# Patient Record
Sex: Female | Born: 1970
Health system: Southern US, Community
[De-identification: ages and names within clinical notes are randomized; demographics above are authoritative.]

## PROBLEM LIST (undated history)

## (undated) DIAGNOSIS — I10 Essential (primary) hypertension: Secondary | ICD-10-CM

---

## 1998-07-13 ENCOUNTER — Ambulatory Visit (HOSPITAL_BASED_OUTPATIENT_CLINIC_OR_DEPARTMENT_OTHER): Admission: RE | Admit: 1998-07-13 | Discharge: 1998-07-13 | Payer: Self-pay | Admitting: Specialist

## 2002-10-15 ENCOUNTER — Ambulatory Visit (HOSPITAL_COMMUNITY): Admission: RE | Admit: 2002-10-15 | Discharge: 2002-10-15 | Payer: Self-pay | Admitting: Family Medicine

## 2002-11-15 ENCOUNTER — Ambulatory Visit (HOSPITAL_COMMUNITY): Admission: RE | Admit: 2002-11-15 | Discharge: 2002-11-15 | Payer: Self-pay | Admitting: Cardiovascular Disease

## 2003-10-02 ENCOUNTER — Ambulatory Visit (HOSPITAL_COMMUNITY): Admission: RE | Admit: 2003-10-02 | Discharge: 2003-10-02 | Payer: Self-pay | Admitting: Orthopaedic Surgery

## 2006-10-11 ENCOUNTER — Ambulatory Visit (HOSPITAL_COMMUNITY): Admission: RE | Admit: 2006-10-11 | Discharge: 2006-10-11 | Payer: Self-pay | Admitting: Obstetrics & Gynecology

## 2006-10-13 ENCOUNTER — Inpatient Hospital Stay (HOSPITAL_COMMUNITY): Admission: AD | Admit: 2006-10-13 | Discharge: 2006-10-15 | Payer: Self-pay | Admitting: Obstetrics and Gynecology

## 2006-10-14 ENCOUNTER — Encounter (INDEPENDENT_AMBULATORY_CARE_PROVIDER_SITE_OTHER): Payer: Self-pay | Admitting: Obstetrics and Gynecology

## 2007-05-02 ENCOUNTER — Encounter (INDEPENDENT_AMBULATORY_CARE_PROVIDER_SITE_OTHER): Payer: Self-pay | Admitting: Obstetrics and Gynecology

## 2007-05-02 ENCOUNTER — Ambulatory Visit (HOSPITAL_COMMUNITY): Admission: RE | Admit: 2007-05-02 | Discharge: 2007-05-02 | Payer: Self-pay | Admitting: Obstetrics and Gynecology

## 2008-05-14 ENCOUNTER — Emergency Department (HOSPITAL_COMMUNITY): Admission: EM | Admit: 2008-05-14 | Discharge: 2008-05-14 | Payer: Self-pay | Admitting: Family Medicine

## 2008-05-28 ENCOUNTER — Emergency Department (HOSPITAL_COMMUNITY): Admission: EM | Admit: 2008-05-28 | Discharge: 2008-05-28 | Payer: Self-pay | Admitting: Family Medicine

## 2008-06-06 ENCOUNTER — Emergency Department (HOSPITAL_COMMUNITY): Admission: EM | Admit: 2008-06-06 | Discharge: 2008-06-06 | Payer: Self-pay | Admitting: Family Medicine

## 2010-02-18 ENCOUNTER — Encounter
Admission: RE | Admit: 2010-02-18 | Discharge: 2010-02-18 | Payer: Self-pay | Source: Home / Self Care | Attending: Cardiology | Admitting: Cardiology

## 2010-03-28 ENCOUNTER — Encounter: Payer: Self-pay | Admitting: Cardiology

## 2010-05-26 ENCOUNTER — Inpatient Hospital Stay (INDEPENDENT_AMBULATORY_CARE_PROVIDER_SITE_OTHER)
Admission: RE | Admit: 2010-05-26 | Discharge: 2010-05-26 | Disposition: A | Discharge status: Home / Self Care | Payer: 59 | Source: Ambulatory Visit | Attending: Emergency Medicine | Admitting: Emergency Medicine

## 2010-05-26 DIAGNOSIS — J309 Allergic rhinitis, unspecified: Secondary | ICD-10-CM

## 2010-05-26 DIAGNOSIS — J069 Acute upper respiratory infection, unspecified: Secondary | ICD-10-CM

## 2010-06-17 LAB — WET PREP, GENITAL: Trich, Wet Prep: NONE SEEN

## 2010-06-17 LAB — POCT PREGNANCY, URINE: Preg Test, Ur: NEGATIVE

## 2010-06-17 LAB — GC/CHLAMYDIA PROBE AMP, GENITAL
Chlamydia, DNA Probe: NEGATIVE
GC Probe Amp, Genital: NEGATIVE

## 2010-06-17 LAB — POCT URINALYSIS DIP (DEVICE)
Bilirubin Urine: NEGATIVE
Glucose, UA: NEGATIVE mg/dL
Hgb urine dipstick: NEGATIVE
Protein, ur: 30 mg/dL — AB
Specific Gravity, Urine: 1.02 (ref 1.005–1.030)

## 2010-07-20 NOTE — Op Note (Signed)
Felicia Boyd, Felicia Boyd                 ACCOUNT NO.:  [id number]   MEDICAL RECORD NO.:  [id number]          PATIENT TYPE:  INP   LOCATION:  9304                          FACILITY:  [facility]   PHYSICIAN:  Kendra H. Tenny Craw, MD     DATE OF BIRTH:  Aug 14, 1970   DATE OF PROCEDURE:  10/15/2006  DATE OF DISCHARGE:                               OPERATIVE REPORT   PREOPERATIVE DIAGNOSIS:  Retained products of conception.   POSTOPERATIVE DIAGNOSIS:  Retained products of conception.   PROCEDURE:  Suction D&C.   SURGEON:  Freddrick March. Tenny Craw, M.D.   ASSISTANT:  None.   ANESTHESIA:  Epidural.   SPECIMENS:  Products of conception disposal for pathology.   ESTIMATED BLOOD LOSS:  500 mL.   COMPLICATIONS:  None.   PROCEDURE:  The patient is a 40 year old G1 P0 who was admitted on  October 13, 2006 at 18 weeks and 6 days of gestation for induction of  labor for an anomalous fetus. She underwent induction of labor with  laminaria and misoprostol and achieved delivery of the fetus at  approximately 2000 hours on October 14, 2006. Immediately following  delivery of the fetus she received Methergine 0.2 mg IM x1 however,  there was no further delivery of placenta and 800 mcg of misoprostol  were then placed transrectally at 2200. By 2330 the placenta delivery  had not been achieved and a manual extraction was attempted, however  complete removal of the placenta was not achieved and the decision was  made to proceed with suction D&C for completion of removal of products  of conception. Following the appropriate informed consent the patient  was brought to the operating room where epidural anesthesia was  confirmed to be adequate.  She was placed in the lithotomy position in  Cecilia stirrups, prepped and draped in the normal sterile fashion.  A  speculum was placed into the vagina.  Single-tooth tenaculum was used to  grasp the anterior lip of the cervix and 10 mL of 1% lidocaine were  injected in a paracervical  fashion with some placental tissue was  removed ring forceps at the internal os. When a sharp curettage was  performed retained products were noted.  Several sharp passes were  performed and several suction passes were performed, however there still  was felt to be retained placental tissue at the fundus and the portable  ultrasound was obtained intraoperatively to directly visualize sharp  passes. Further placental tissue was removed and a final suction pass  was performed.  Final  ultrasound demonstrated a thin stripe with no evidence of retained  products. Estimated blood loss for this procedure was approximately 500  mL.  The patient tolerated the procedure well and was brought to the  recovery room in a stable condition following the procedure.      Freddrick March. Tenny Craw, MD  Electronically Signed     KHR/MEDQ  D:  10/15/2006  T:  10/15/2006  Job:  [id number]

## 2010-07-20 NOTE — H&P (Signed)
NAME:  Felicia Boyd, Felicia Boyd                 ACCOUNT NO.:  [id number]   MEDICAL RECORD NO.:  [id number]          PATIENT TYPE:  AMB   LOCATION:  SDC                           [facility]ILITY:  [facility]   PHYSICIAN:  Kendra H. Tenny Craw, MD     DATE OF BIRTH:  Oct 27, 1970   DATE OF ADMISSION:  05/02/2007  DATE OF DISCHARGE:                              HISTORY & PHYSICAL   CHIEF COMPLAINT:  9-week missed abortion.   HISTORY OF PRESENT ILLNESS:  Felicia Boyd is a 40 year old gravida 3, para  0-0-2-0 by a first trimester ultrasound at 6 weeks.  She has an  estimated date of confinement of November 26, 2007 with an estimated  gestational age of [redacted] weeks and 6 days today.  The patient noticed an  episode of spotting after intercourse last week.  This resolved itself  without cramping.  Again today, she re-experienced spotting and did have  some cramping and came in for evaluation.  Upon evaluation, the patient  was noted to have a an intrauterine pregnancy measuring 7 weeks and 1  day with no fetal cardiac activity identified, and a missed abortion was  confirmed.  After discussion with the patient regarding options of  expectant management verus medical management versus surgical  management, the patient elected to proceed with surgical management and  presents on May 02, 2007 for a suction D&C.   PAST MEDICAL HISTORY:  Chronic hypertension.  She had previously been  taking Toprol-XL outside of pregnancy.  However, during this pregnancy  she has been normotensive and has not been on any medications.   PAST SURGICAL HISTORY:  A D&C in 2008 for retained placenta.   PAST OBSTETRICAL HISTORY:  Gravida 3, para 0-0-2-0.  In 1994, first  trimester spontaneous abortion.  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  She did require a D&C for retained placenta.   PAST GYN HISTORY:  No abnormal  Pap smears.  No STDs.   SOCIAL HISTORY:  Negative x3.   FAMILY HISTORY:  Noncontributory.   PHYSICAL EXAMINATION:  VITAL SIGNS:  She weighs 200 pounds.  Blood  pressure is 102/70.  GENERAL:  She is alert and oriented x3.  ABDOMEN:  Soft and nontender.  PELVIC:  Normal external female genitalia.  The vagina is pink and well-  rugated.  There is some red menstrual-appearing fluid at the vaginal  vault.  No active bleeding is noted.  No clots.   Ultrasound x2 confirms an intrauterine fetal pole measuring 7 weeks and  1 day with no visible cardiac activity.   ASSESSMENT AND PLAN:  This is a gravida 3, para 0-0-2-0 at 9 weeks and 6  days with a missed abortion.  We will proceed with suction dilatation  and curettage.      Freddrick March. Tenny Craw, MD  Electronically Signed     KHR/MEDQ  D:  05/01/2007  T:  05/01/2007  Job:  [id number]

## 2010-07-20 NOTE — Discharge Summary (Signed)
[name], Felicia Boyd                 ACCOUNT NO.:  [id number]   MEDICAL RECORD NO.:  [id number]          PATIENT TYPE:  INP   LOCATION:  9304                          [facility]   PHYSICIAN:  Kendra H. Tenny Craw, MD     DATE OF BIRTH:  1970/06/19   DATE OF ADMISSION:  10/13/2006  DATE OF DISCHARGE:  10/15/2006                               DISCHARGE SUMMARY   FINAL DIAGNOSIS:  1. Intrauterine pregnancy at [redacted] weeks gestation.  2. Multiple fetal anomalies.  3. Induction of labor.   PROCEDURE:  Suction D&C.   SURGEON:  Dr. Almon Hercules.   COMPLICATIONS:  None.   HISTORY:  This 40 year old G1, P0, was admitted to the [facility]  at 60 and 6/7 weeks' gestation for induction of labor secondary to fetus  with multiple known anomalies.  The patient's antepartum course up to  this point had been complicated when she had an ultrasound that showed  bilateral ventramegaly with encephalocele.  The patient did have  amniocentesis which did show 46XX karyotype.  The patient does have  chronic hypertension controlled with Labetalol 200mg  BID and advanced  maternal age.  The patient was evaluated by the High Risk OB service and  ultrasound findings were reconfirmed.  The patient was counseled  regarding poor prognosis given significant ventriculomegaly and  encephalocele.  She elected to proceed with pregnancy termination.  At  this point she was admitted by Dr. Waynard Reeds.  She had laminaria and  misoprostol and she achieved delivery on 10/14/2006.  The placenta did  not immediately deliver and manual extraction was unsuccessful.  The  patient, at this point, had to be taken to the operating room by Dr.  Waynard Reeds where a D&C was performed for removal of products of  conception.  The procedure was performed without complications.  The  patient's hospital course was benign without any significant fevers.  The patient did request autopsy of the fetus have some support.   DISCHARGE  MEDICATIONS:  She was sent home on Vicodin 5 mg, 1-2 every 4-6  hours as needed for her pain.  She was given Ambien CR 12.5 mg to use at  bedtime as needed, to continue with folic acid 4 mg daily with followup  with Dr. Waynard Reeds in 2 weeks.   LABORATORY DATA:  Hemoglobin of 11.3, white blood cell count of 13.9 and  platelets of 187,000.      Leilani Able, P.A.-C.      Freddrick March. Tenny Craw, MD  Electronically Signed    MB/MEDQ  D:  11/15/2006  T:  11/16/2006  Job:  [id number]

## 2010-07-20 NOTE — Op Note (Signed)
NAME:  Felicia Boyd, Felicia Boyd                 ACCOUNT NO.:  [id number]   MEDICAL RECORD NO.:  [id number]          PATIENT TYPE:  AMB   LOCATION:  SDC                           [facility]   PHYSICIAN:  Kendra H. Tenny Craw, MD     DATE OF BIRTH:  [birth year]   DATE OF PROCEDURE:  05/02/2007  DATE OF DISCHARGE:                               OPERATIVE REPORT   PREOPERATIVE DIAGNOSIS:  9-week missed abortion.   POSTOPERATIVE DIAGNOSIS:  9-week missed abortion.   PROCEDURE:  Suction dilation and curettage.   SURGEON:  Freddrick March. Tenny Craw, MD   ASSISTANT:  None.   ANESTHESIA:  MAC and 1% lidocaine paracervical block 10 mL.   OPERATIVE FINDINGS:  Uterus approximately eight week size with products  of conception.   SPECIMENS:  Products of conception.   DISPOSITION OF SPECIMENS:  To pathology and for karyotype analysis.   ESTIMATED BLOOD LOSS:  Minimal.   COMPLICATIONS:  None.   PROCEDURE:  [name] is a 40 year old G3, P0-0-2-0 who was  approximately 9 weeks and 6 days by a ultrasound at 6 weeks, who  presented to the office on May 01, 2007 complaining of spotting.  An ultrasound was performed at that time which demonstrated a  intrauterine pregnancy with the fetal pole measuring 7 weeks and one  days with no cardiac activity was identified.  She had previously had  two separate ultrasounds one at 6 weeks and 2 days, one at 7 weeks and 3  days which demonstrated a viable intrauterine pregnancy and therefore  this confirmed fetal demise.  The patient was offered expectant medical  versus surgical management and elected to proceed with surgical  management.  Risks, benefits and alternatives of the procedure were  discussed including damage to internal organs, bowels, bladder, blood  vessels, bleeding, infection and the patient wished to proceed with the  procedure.   Following the appropriate informed consent, the patient was brought to  the operating room where MAC anesthesia was  administered.  She was  placed in the dorsal lithotomy position.  She was prepped and draped in  the normal sterile fashion.  The bladder was drained of approximately  100 mL of clear urine.  A speculum was placed in the vagina.  Single-  toothed tenaculum was used to grasp the anterior lip of the cervix.  A  1% paracervical block of lidocaine was then administered.  The cervix  was serially dilated.  A #7 suction curette was then passed to the  fundus.  Vacuum was engaged.  Three suction curettage passes were  performed with removal of products of conception.  A sharp curettage was  then performed with further removal of products of conception.  A fourth  suction pass was performed with minimal residual products of conception  and a final curettage pass was performed with minimal further products  of conception.  This completed the procedure.  The single-toothed  tenaculum was removed from the anterior lip of the cervix.  The cervix  was found to be hemostatic.  The speculum was then removed from the  vagina.  The MAC anesthesia was reversed and the patient was brought to  the recovery room in stable condition following the procedure.      Freddrick March. Tenny Craw, MD  Electronically Signed     KHR/MEDQ  D:  05/02/2007  T:  05/03/2007  Job:  [id number]

## 2010-07-23 NOTE — Procedures (Signed)
   NAME:  Felicia Boyd, Felicia Boyd                           ACCOUNT NO.:  [id number]   MEDICAL RECORD NO.:  [id number]                   PATIENT TYPE:  OUT   LOCATION:  RAD                                  FACILITY:  [facility]   PHYSICIAN:  Nicki Guadalajara, M.D.                  DATE OF BIRTH:  12-21-1970   DATE OF PROCEDURE:  11/15/2002  DATE OF DISCHARGE:                                  ECHOCARDIOGRAM   PROCEDURE:  2-D echo Doppler study.   INDICATIONS FOR PROCEDURE:  Felicia Boyd is a 40 year old African-  American female that was recently found to have intermittent low atrial  rhythm. She has noticed some palpitations and has a soft murmur. She is  referred for further evaluation.   SUMMARY:  1. Technically, this is an adequate M-mode 2-dimensional comprehensive echo     Doppler study.  2. Left ventricular wall thickness was upper normal at 11 mm septally and 9     mm posteriorly. The left ventricular end diastolic and end systolic     dimensions are normal at 44 and 28 mm, respectively. Systolic function     was excellent with an estimated ejection fraction of at least 55% to 60%.     There were no focal segmental wall motion abnormalities.  3. Left atrial dimension was upper normal to slightly increased at 41 mm.     Right atrium and right ventricle were normal.  4. Aortic root dimension was normal at 26 mm.  5. Aortic valve was trileaflet and delicate. There was no aortic     regurgitation.  6. Mitral valve leaflets were relatively delicate. There was minimal     thickening of the anterior leaflet. There was trivial/physiologic mitral     regurgitation, not felt to be significant.  7. The tricuspid valve was structurally normal.  8. The pulmonic valve was structurally normal.  9. Doppler study did not demonstrate any abnormal increased velocities.    IMPRESSION:  Technically, this is an adequate M-mode 2-dimensional  comprehensive Doppler echocardiogram, demonstrating normal left  ventricular  size and function. There is very minimal left atrial enlargement at 41 mm.  There is trivial/physiologic mitral regurgitation, not felt to be  significant.                                                Nicki Guadalajara, M.D.    TK/MEDQ  D:  11/15/2002  T:  11/15/2002  Job:  [id number]   cc:   Annia Friendly. Loleta Chance, M.D.  [address]  [place]  [place] [address]  Fax: [phone]

## 2010-08-17 ENCOUNTER — Other Ambulatory Visit: Payer: Self-pay | Admitting: Obstetrics and Gynecology

## 2010-09-21 ENCOUNTER — Encounter (INDEPENDENT_AMBULATORY_CARE_PROVIDER_SITE_OTHER): Payer: 59

## 2010-09-21 DIAGNOSIS — R030 Elevated blood-pressure reading, without diagnosis of hypertension: Secondary | ICD-10-CM

## 2010-11-26 LAB — CBC
MCHC: 34.7
Platelets: 243
RBC: 4.81
WBC: 6.1

## 2010-12-20 LAB — CBC
HCT: 33.2 — ABNORMAL LOW
Hemoglobin: 13.3
MCHC: 33.6
MCV: 88
MCV: 88.2
Platelets: 243
RBC: 4.5
WBC: 9.9

## 2011-01-14 ENCOUNTER — Other Ambulatory Visit: Payer: Self-pay | Admitting: Family Medicine

## 2011-01-14 DIAGNOSIS — Z1231 Encounter for screening mammogram for malignant neoplasm of breast: Secondary | ICD-10-CM

## 2011-02-23 ENCOUNTER — Ambulatory Visit
Admission: RE | Admit: 2011-02-23 | Discharge: 2011-02-23 | Disposition: A | Discharge status: Home / Self Care | Payer: 59 | Source: Ambulatory Visit | Attending: Family Medicine | Admitting: Family Medicine

## 2011-02-23 DIAGNOSIS — Z1231 Encounter for screening mammogram for malignant neoplasm of breast: Secondary | ICD-10-CM

## 2012-02-08 ENCOUNTER — Other Ambulatory Visit: Payer: Self-pay | Admitting: Family Medicine

## 2012-02-08 DIAGNOSIS — Z1231 Encounter for screening mammogram for malignant neoplasm of breast: Secondary | ICD-10-CM

## 2012-03-15 ENCOUNTER — Ambulatory Visit
Admission: RE | Admit: 2012-03-15 | Discharge: 2012-03-15 | Disposition: A | Discharge status: Home / Self Care | Payer: 59 | Source: Ambulatory Visit | Attending: Family Medicine | Admitting: Family Medicine

## 2012-03-15 DIAGNOSIS — Z1231 Encounter for screening mammogram for malignant neoplasm of breast: Secondary | ICD-10-CM

## 2012-12-20 ENCOUNTER — Other Ambulatory Visit: Payer: Self-pay | Admitting: Obstetrics and Gynecology

## 2013-03-18 ENCOUNTER — Other Ambulatory Visit: Payer: Self-pay

## 2013-03-18 DIAGNOSIS — Z1231 Encounter for screening mammogram for malignant neoplasm of breast: Secondary | ICD-10-CM

## 2013-04-05 ENCOUNTER — Ambulatory Visit
Admission: RE | Admit: 2013-04-05 | Discharge: 2013-04-05 | Disposition: A | Discharge status: Home / Self Care | Payer: Self-pay | Source: Ambulatory Visit

## 2013-04-05 DIAGNOSIS — Z1231 Encounter for screening mammogram for malignant neoplasm of breast: Secondary | ICD-10-CM

## 2013-06-14 ENCOUNTER — Emergency Department (HOSPITAL_COMMUNITY)
Admission: EM | Admit: 2013-06-14 | Discharge: 2013-06-14 | Disposition: A | Discharge status: Home / Self Care | Payer: 59 | Source: Home / Self Care | Attending: Emergency Medicine | Admitting: Emergency Medicine

## 2013-06-14 ENCOUNTER — Encounter (HOSPITAL_COMMUNITY): Payer: Self-pay | Admitting: Emergency Medicine

## 2013-06-14 DIAGNOSIS — B029 Zoster without complications: Secondary | ICD-10-CM

## 2013-06-14 HISTORY — DX: Essential (primary) hypertension: I10

## 2013-06-14 MED ORDER — PREDNISONE 20 MG PO TABS: 20.0000 mg | ORAL_TABLET | Freq: Two times a day (BID) | ORAL | Status: DC

## 2013-06-14 MED ORDER — HYDROCODONE-ACETAMINOPHEN 5-325 MG PO TABS: ORAL_TABLET | ORAL | Status: DC

## 2013-06-14 MED ORDER — VALACYCLOVIR HCL 1 G PO TABS: 1000.0000 mg | ORAL_TABLET | Freq: Two times a day (BID) | ORAL | Status: DC

## 2013-06-14 NOTE — ED Provider Notes (Signed)
  Chief Complaint    Chief Complaint  Patient presents with  . Rash    History of Present Illness      Felicia Boyd is a 43 year old female who has had a three-day history of a rash on her right upper chest and upper back. It's painful and it tingles. She denies any rash elsewhere, fever, chills, weakness, or numbness in the upper extremity. She has had chickenpox in the past. No history of shingles.  Review of Systems   Other than as noted above, the patient denies any of the following symptoms: Systemic:  No fever or chills. ENT:  No nasal congestion, rhinorrhea, sore throat, swelling of lips, tongue or throat. Resp:  No cough, wheezing, or shortness of breath.  PMFSH    Past medical history, family history, social history, meds, and allergies were reviewed. She has high blood pressure and takes hydrochlorothiazide, losartan, and Lopressor.  Physical Exam     Vital signs:  BP 142/97  Pulse 84  Temp(Src) 97.8 F (36.6 C) (Oral)  Resp 18  SpO2 97%  LMP 05/29/2013 Gen:  Alert, oriented, in no distress. ENT:  Pharynx clear, no intraoral lesions, moist mucous membranes. Lungs:  Clear to auscultation. Skin:  There are patches of erythematous maculopapules on the right upper back and right upper chest. No vesicles. No extra dermatomal lesions.  Assessment    The encounter diagnosis was Shingles.  Plan     1.  Meds:  The following meds were prescribed:   Discharge Medication List as of 06/14/2013  8:40 PM    START taking these medications   Details  HYDROcodone-acetaminophen (NORCO/VICODIN) 5-325 MG per tablet 1 to 2 tabs every 4 to 6 hours as needed for pain., Print    predniSONE (DELTASONE) 20 MG tablet Take 1 tablet (20 mg total) by mouth 2 (two) times daily., Starting 06/14/2013, Until Discontinued, Normal    valACYclovir (VALTREX) 1000 MG tablet Take 1 tablet (1,000 mg total) by mouth 2 (two) times daily., Starting 06/14/2013, Until Discontinued, Normal        2.   Patient Education/Counseling:  The patient was given appropriate handouts, self care instructions, and instructed in symptomatic relief.    3.  Follow up:  The patient was told to follow up here if no better in 3 to 4 days, or sooner if becoming worse in any way, and given some red flag symptoms such as worsening rash, fever, or difficulty breathing which would prompt immediate return.  Follow up here if necessary.      Reuben Likesavid C Eliezer Khawaja, MD 06/14/13 2132

## 2013-06-14 NOTE — Discharge Instructions (Signed)
Shingles Shingles (herpes zoster) is an infection that is caused by the same virus that causes chickenpox (varicella). The infection causes a painful skin rash and fluid-filled blisters, which eventually break open, crust over, and heal. It may occur in any area of the body, but it usually affects only one side of the body or face. The pain of shingles usually lasts about 1 month. However, some people with shingles may develop long-term (chronic) pain in the affected area of the body. Shingles often occurs many years after the person had chickenpox. It is more common:  In people older than 50 years.  In people with weakened immune systems, such as those with HIV, AIDS, or cancer.  In people taking medicines that weaken the immune system, such as transplant medicines.  In people under great stress. CAUSES  Shingles is caused by the varicella zoster virus (VZV), which also causes chickenpox. After a person is infected with the virus, it can remain in the person's body for years in an inactive state (dormant). To cause shingles, the virus reactivates and breaks out as an infection in a nerve root. The virus can be spread from person to person (contagious) through contact with open blisters of the shingles rash. It will only spread to people who have not had chickenpox. When these people are exposed to the virus, they may develop chickenpox. They will not develop shingles. Once the blisters scab over, the person is no longer contagious and cannot spread the virus to others. SYMPTOMS  Shingles shows up in stages. The initial symptoms may be pain, itching, and tingling in an area of the skin. This pain is usually described as burning, stabbing, or throbbing.In a few days or weeks, a painful red rash will appear in the area where the pain, itching, and tingling were felt. The rash is usually on one side of the body in a band or belt-like pattern. Then, the rash usually turns into fluid-filled blisters. They  will scab over and dry up in approximately 2 3 weeks. Flu-like symptoms may also occur with the initial symptoms, the rash, or the blisters. These may include:  Fever.  Chills.  Headache.  Upset stomach. DIAGNOSIS  Your caregiver will perform a skin exam to diagnose shingles. Skin scrapings or fluid samples may also be taken from the blisters. This sample will be examined under a microscope or sent to a lab for further testing. TREATMENT  There is no specific cure for shingles. Your caregiver will likely prescribe medicines to help you manage the pain, recover faster, and avoid long-term problems. This may include antiviral drugs, anti-inflammatory drugs, and pain medicines. HOME CARE INSTRUCTIONS   Take a cool bath or apply cool compresses to the area of the rash or blisters as directed. This may help with the pain and itching.   Only take over-the-counter or prescription medicines as directed by your caregiver.   Rest as directed by your caregiver.  Keep your rash and blisters clean with mild soap and cool water or as directed by your caregiver.  Do not pick your blisters or scratch your rash. Apply an anti-itch cream or numbing creams to the affected area as directed by your caregiver.  Keep your shingles rash covered with a loose bandage (dressing).  Avoid skin contact with:  Babies.   Pregnant women.   Children with eczema.   Elderly people with transplants.   People with chronic illnesses, such as leukemia or AIDS.   Wear loose-fitting clothing to help ease   the pain of material rubbing against the rash.  Keep all follow-up appointments with your caregiver.If the area involved is on your face, you may receive a referral for follow-up to a specialist, such as an eye doctor (ophthalmologist) or an ear, nose, and throat (ENT) doctor. Keeping all follow-up appointments will help you avoid eye complications, chronic pain, or disability.  SEEK IMMEDIATE MEDICAL  CARE IF:   You have facial pain, pain around the eye area, or loss of feeling on one side of your face.  You have ear pain or ringing in your ear.  You have loss of taste.  Your pain is not relieved with prescribed medicines.   Your redness or swelling spreads.   You have more pain and swelling.  Your condition is worsening or has changed.   You have a feveror persistent symptoms for more than 2 3 days.  You have a fever and your symptoms suddenly get worse. MAKE SURE YOU:  Understand these instructions.  Will watch your condition.  Will get help right away if you are not doing well or get worse. Document Released: 02/21/2005 Document Revised: 11/16/2011 Document Reviewed: 10/06/2011 [employer] Patient Information 2014 [employer].  

## 2013-06-14 NOTE — ED Notes (Signed)
Pt c/o rash on right side of chest and wraps around back upper right Rash is pain and tingly; thinks it maybe the shingles Denies f/v/n/d, cold sxs Alert w/no signs of acute distress.

## 2014-02-06 ENCOUNTER — Other Ambulatory Visit: Payer: Self-pay | Admitting: Obstetrics and Gynecology

## 2014-02-07 LAB — CYTOLOGY - PAP

## 2014-04-22 ENCOUNTER — Other Ambulatory Visit: Payer: Self-pay

## 2014-04-22 DIAGNOSIS — Z1231 Encounter for screening mammogram for malignant neoplasm of breast: Secondary | ICD-10-CM

## 2014-05-12 ENCOUNTER — Other Ambulatory Visit: Payer: Self-pay

## 2014-05-12 ENCOUNTER — Ambulatory Visit
Admission: RE | Admit: 2014-05-12 | Discharge: 2014-05-12 | Disposition: A | Discharge status: Home / Self Care | Payer: 59 | Source: Ambulatory Visit

## 2014-05-12 DIAGNOSIS — Z1231 Encounter for screening mammogram for malignant neoplasm of breast: Secondary | ICD-10-CM

## 2015-04-30 MED FILL — METOPROLOL SUCC ER 25 MG TA: 25 | 90 days supply | Qty: 90 | Fill #0

## 2015-04-30 MED FILL — LOSARTAN POTASSIUM 100 MG T: 100 | 90 days supply | Qty: 90 | Fill #0

## 2015-04-30 MED FILL — HYDROCHLOROTHIAZIDE 12.5 MG: 12.5 | 90 days supply | Qty: 90 | Fill #0

## 2015-07-28 ENCOUNTER — Other Ambulatory Visit: Payer: Self-pay

## 2015-07-28 DIAGNOSIS — Z1231 Encounter for screening mammogram for malignant neoplasm of breast: Secondary | ICD-10-CM

## 2015-08-05 ENCOUNTER — Ambulatory Visit
Admission: RE | Admit: 2015-08-05 | Discharge: 2015-08-05 | Disposition: A | Discharge status: Home / Self Care | Payer: 59 | Source: Ambulatory Visit

## 2015-08-05 DIAGNOSIS — Z1231 Encounter for screening mammogram for malignant neoplasm of breast: Secondary | ICD-10-CM

## 2015-08-07 ENCOUNTER — Other Ambulatory Visit: Payer: Self-pay | Admitting: Family Medicine

## 2015-08-07 DIAGNOSIS — R928 Other abnormal and inconclusive findings on diagnostic imaging of breast: Secondary | ICD-10-CM

## 2015-08-11 ENCOUNTER — Ambulatory Visit
Admission: RE | Admit: 2015-08-11 | Discharge: 2015-08-11 | Disposition: A | Discharge status: Home / Self Care | Payer: 59 | Source: Ambulatory Visit | Attending: Family Medicine | Admitting: Family Medicine

## 2015-08-11 ENCOUNTER — Other Ambulatory Visit: Payer: Self-pay | Admitting: Obstetrics and Gynecology

## 2015-08-11 DIAGNOSIS — Z6838 Body mass index (BMI) 38.0-38.9, adult: Secondary | ICD-10-CM | POA: Diagnosis not present

## 2015-08-11 DIAGNOSIS — Z01419 Encounter for gynecological examination (general) (routine) without abnormal findings: Secondary | ICD-10-CM | POA: Diagnosis not present

## 2015-08-11 DIAGNOSIS — R928 Other abnormal and inconclusive findings on diagnostic imaging of breast: Secondary | ICD-10-CM

## 2015-08-11 DIAGNOSIS — Z124 Encounter for screening for malignant neoplasm of cervix: Secondary | ICD-10-CM | POA: Diagnosis not present

## 2015-08-11 MED FILL — IBUPROFEN 600 MG TABLET: 600 | 90 days supply | Qty: 90 | Fill #0

## 2015-08-12 LAB — CYTOLOGY - PAP

## 2015-08-20 MED FILL — LOSARTAN POTASSIUM 100 MG T: 100 | 90 days supply | Qty: 90 | Fill #1

## 2015-08-20 MED FILL — METOPROLOL SUCC ER 25 MG TA: 25 | 90 days supply | Qty: 90 | Fill #1

## 2015-08-20 MED FILL — HYDROCHLOROTHIAZIDE 12.5 MG: 12.5 | 90 days supply | Qty: 90 | Fill #1

## 2015-12-24 MED FILL — HYDROCHLOROTHIAZIDE 12.5 MG: 12.5 | 90 days supply | Qty: 90 | Fill #2

## 2015-12-24 MED FILL — METOPROLOL SUCC ER 25 MG TA: 25 | 90 days supply | Qty: 90 | Fill #2

## 2015-12-24 MED FILL — LOSARTAN POTASSIUM 100 MG T: 100 | 90 days supply | Qty: 90 | Fill #2

## 2016-02-03 DIAGNOSIS — E785 Hyperlipidemia, unspecified: Secondary | ICD-10-CM | POA: Diagnosis not present

## 2016-02-03 DIAGNOSIS — I10 Essential (primary) hypertension: Secondary | ICD-10-CM | POA: Diagnosis not present

## 2016-04-11 MED FILL — LOSARTAN POTASSIUM 100 MG T: 100 | 90 days supply | Qty: 90 | Fill #0

## 2016-04-11 MED FILL — METOPROLOL SUCC ER 25 MG TA: 25 | 90 days supply | Qty: 90 | Fill #0

## 2016-04-11 MED FILL — HYDROCHLOROTHIAZIDE 12.5 MG: 12.5 | 90 days supply | Qty: 90 | Fill #0

## 2016-04-11 MED FILL — IBUPROFEN 600 MG TABLET: 600 | 90 days supply | Qty: 90 | Fill #1

## 2016-07-05 ENCOUNTER — Other Ambulatory Visit: Payer: Self-pay | Admitting: Family Medicine

## 2016-07-05 DIAGNOSIS — Z1231 Encounter for screening mammogram for malignant neoplasm of breast: Secondary | ICD-10-CM

## 2016-07-29 DIAGNOSIS — Z0000 Encounter for general adult medical examination without abnormal findings: Secondary | ICD-10-CM | POA: Diagnosis not present

## 2016-07-29 DIAGNOSIS — E785 Hyperlipidemia, unspecified: Secondary | ICD-10-CM | POA: Diagnosis not present

## 2016-07-29 DIAGNOSIS — I10 Essential (primary) hypertension: Secondary | ICD-10-CM | POA: Diagnosis not present

## 2016-07-29 MED FILL — LANSOPRAZOLE DR 30 MG CAP: 30 | 90 days supply | Qty: 90 | Fill #0

## 2016-07-29 MED FILL — LOSARTAN POTASSIUM 100 MG T: 100 | 90 days supply | Qty: 90 | Fill #0

## 2016-07-29 MED FILL — HYDROCHLOROTHIAZIDE 12.5 MG: 12.5 | 90 days supply | Qty: 90 | Fill #0

## 2016-07-29 MED FILL — IBUPROFEN 600 MG TABLET: 600 | 90 days supply | Qty: 90 | Fill #2

## 2016-07-29 MED FILL — METOPROLOL SUCC ER 25 MG TA: 25 | 90 days supply | Qty: 90 | Fill #0

## 2016-08-11 ENCOUNTER — Ambulatory Visit
Admission: RE | Admit: 2016-08-11 | Discharge: 2016-08-11 | Disposition: A | Discharge status: Home / Self Care | Payer: 59 | Source: Ambulatory Visit | Attending: Family Medicine | Admitting: Family Medicine

## 2016-08-11 DIAGNOSIS — Z1231 Encounter for screening mammogram for malignant neoplasm of breast: Secondary | ICD-10-CM

## 2016-10-17 DIAGNOSIS — Z124 Encounter for screening for malignant neoplasm of cervix: Secondary | ICD-10-CM | POA: Diagnosis not present

## 2016-10-17 DIAGNOSIS — Z01419 Encounter for gynecological examination (general) (routine) without abnormal findings: Secondary | ICD-10-CM | POA: Diagnosis not present

## 2017-01-18 MED FILL — METOPROLOL SUCC ER 25 MG TA: 25 | 90 days supply | Qty: 90 | Fill #1

## 2017-01-18 MED FILL — LOSARTAN POTASSIUM 100 MG T: 100 | 90 days supply | Qty: 90 | Fill #1

## 2017-01-18 MED FILL — IBUPROFEN 600 MG TABLET: 600 | 90 days supply | Qty: 90 | Fill #0

## 2017-01-18 MED FILL — HYDROCHLOROTHIAZIDE 12.5 MG: 12.5 | 90 days supply | Qty: 90 | Fill #1

## 2017-05-25 MED FILL — HYDROCHLOROTHIAZIDE 12.5 MG: 12.5 | 90 days supply | Qty: 90 | Fill #2

## 2017-05-25 MED FILL — LANSOPRAZOLE DR 30 MG CAP: 30 | 90 days supply | Qty: 90 | Fill #1

## 2017-05-25 MED FILL — METOPROLOL SUCCINATE ER 25: 25 | 90 days supply | Qty: 90 | Fill #2

## 2017-05-25 MED FILL — LOSARTAN POTASSIUM 100 MG T: 100 | 90 days supply | Qty: 90 | Fill #2

## 2017-06-27 ENCOUNTER — Other Ambulatory Visit: Payer: Self-pay | Admitting: Family Medicine

## 2017-06-27 DIAGNOSIS — Z1231 Encounter for screening mammogram for malignant neoplasm of breast: Secondary | ICD-10-CM

## 2017-06-28 ENCOUNTER — Telehealth: Payer: 59 | Admitting: Nurse Practitioner

## 2017-06-28 DIAGNOSIS — R05 Cough: Secondary | ICD-10-CM

## 2017-06-28 DIAGNOSIS — R059 Cough, unspecified: Secondary | ICD-10-CM

## 2017-06-28 MED ORDER — BENZONATATE 100 MG PO CAPS
100.0000 mg | ORAL_CAPSULE | Freq: Three times a day (TID) | ORAL | 0 refills | Status: DC | PRN
Start: 2017-06-28 — End: 2023-03-15

## 2017-06-28 NOTE — Progress Notes (Signed)
We are sorry that you are not feeling well.  Here is how we plan to help!  Based on your presentation I believe you most likely have A cough due to allergies.  I recommend that you start the an over-the counter-allergy medication such as Claritin 10 mg or Zyrtec 10 mg daily.     In addition you may use A prescription cough medication called Tessalon Perles 100mg. You may take 1-2 capsules every 8 hours as needed for your cough.   From your responses in the eVisit questionnaire you describe inflammation in the upper respiratory tract which is causing a significant cough.  This is commonly called Bronchitis and has four common causes:    Allergies  Viral Infections  Acid Reflux  Bacterial Infection Allergies, viruses and acid reflux are treated by controlling symptoms or eliminating the cause. An example might be a cough caused by taking certain blood pressure medications. You stop the cough by changing the medication. Another example might be a cough caused by acid reflux. Controlling the reflux helps control the cough.  USE OF BRONCHODILATOR ("RESCUE") INHALERS: There is a risk from using your bronchodilator too frequently.  The risk is that over-reliance on a medication which only relaxes the muscles surrounding the breathing tubes can reduce the effectiveness of medications prescribed to reduce swelling and congestion of the tubes themselves.  Although you feel brief relief from the bronchodilator inhaler, your asthma may actually be worsening with the tubes becoming more swollen and filled with mucus.  This can delay other crucial treatments, such as oral steroid medications. If you need to use a bronchodilator inhaler daily, several times per day, you should discuss this with your provider.  There are probably better treatments that could be used to keep your asthma under control.     HOME CARE . Only take medications as instructed by your medical team. . Complete the entire course of an  antibiotic. . Drink plenty of fluids and get plenty of rest. . Avoid close contacts especially the very young and the elderly . Cover your mouth if you cough or cough into your sleeve. . Always remember to wash your hands . A steam or ultrasonic humidifier can help congestion.   GET HELP RIGHT AWAY IF: . You develop worsening fever. . You become short of breath . You cough up blood. . Your symptoms persist after you have completed your treatment plan MAKE SURE YOU   Understand these instructions.  Will watch your condition.  Will get help right away if you are not doing well or get worse.  Your e-visit answers were reviewed by a board certified advanced clinical practitioner to complete your personal care plan.  Depending on the condition, your plan could have included both over the counter or prescription medications. If there is a problem please reply  once you have received a response from your provider. Your safety is important to us.  If you have drug allergies check your prescription carefully.    You can use MyChart to ask questions about today's visit, request a non-urgent call back, or ask for a work or school excuse for 24 hours related to this e-Visit. If it has been greater than 24 hours you will need to follow up with your provider, or enter a new e-Visit to address those concerns. You will get an e-mail in the next two days asking about your experience.  I hope that your e-visit has been valuable and will speed your recovery. Thank   you for using e-visits.   

## 2017-08-11 DIAGNOSIS — E785 Hyperlipidemia, unspecified: Secondary | ICD-10-CM | POA: Diagnosis not present

## 2017-08-11 DIAGNOSIS — I10 Essential (primary) hypertension: Secondary | ICD-10-CM | POA: Diagnosis not present

## 2017-08-11 DIAGNOSIS — Z136 Encounter for screening for cardiovascular disorders: Secondary | ICD-10-CM | POA: Diagnosis not present

## 2017-08-11 DIAGNOSIS — Z0000 Encounter for general adult medical examination without abnormal findings: Secondary | ICD-10-CM | POA: Diagnosis not present

## 2017-08-14 ENCOUNTER — Ambulatory Visit
Admission: RE | Admit: 2017-08-14 | Discharge: 2017-08-14 | Disposition: A | Discharge status: Home / Self Care | Payer: 59 | Source: Ambulatory Visit | Attending: Family Medicine | Admitting: Family Medicine

## 2017-08-14 DIAGNOSIS — Z1231 Encounter for screening mammogram for malignant neoplasm of breast: Secondary | ICD-10-CM | POA: Diagnosis not present

## 2017-10-11 MED FILL — HYDROCHLOROTHIAZIDE 12.5 MG: 12.5 | 90 days supply | Qty: 90 | Fill #0

## 2017-10-11 MED FILL — LOSARTAN POTASSIUM 100 MG T: 100 | 90 days supply | Qty: 90 | Fill #0

## 2017-10-11 MED FILL — METOPROLOL SUCCINATE ER 25: 25 | 90 days supply | Qty: 90 | Fill #0

## 2017-11-27 DIAGNOSIS — Z23 Encounter for immunization: Secondary | ICD-10-CM | POA: Diagnosis not present

## 2017-12-07 DIAGNOSIS — Z111 Encounter for screening for respiratory tuberculosis: Secondary | ICD-10-CM | POA: Diagnosis not present

## 2017-12-14 DIAGNOSIS — H0014 Chalazion left upper eyelid: Secondary | ICD-10-CM | POA: Diagnosis not present

## 2018-02-08 MED FILL — LOSARTAN POTASSIUM 100 MG T: 100 | 90 days supply | Qty: 90 | Fill #1

## 2018-02-08 MED FILL — METOPROLOL SUCCINATE ER 25: 25 | 90 days supply | Qty: 90 | Fill #1

## 2018-02-08 MED FILL — HYDROCHLOROTHIAZIDE 12.5 MG: 12.5 | 90 days supply | Qty: 90 | Fill #1

## 2018-03-02 ENCOUNTER — Encounter: Payer: Self-pay | Admitting: Family

## 2018-03-02 ENCOUNTER — Telehealth: Payer: 59 | Admitting: Family

## 2018-03-02 DIAGNOSIS — M545 Low back pain, unspecified: Secondary | ICD-10-CM

## 2018-03-02 MED ORDER — BACLOFEN 10 MG PO TABS: 10.0000 mg | ORAL_TABLET | Freq: Three times a day (TID) | ORAL | 0 refills | Status: DC | PRN

## 2018-03-02 MED ORDER — ETODOLAC 300 MG PO CAPS: 300.0000 mg | ORAL_CAPSULE | Freq: Two times a day (BID) | ORAL | 0 refills | Status: DC

## 2018-03-02 NOTE — Progress Notes (Signed)
Thank you for the details you included in the comment boxes. Those details are very helpful in determining the best course of treatment for you and help us to provide the best care. Please use ice ice ice. They changed the guidelines a couple years ago away from ice first 24, to ice until healed. You can use heat sometimes if it makes you feel better; just know that it will not help you heal. The ice is critical until you recover; 20 minutes 3-5 times a day. It's a hassle, but helps tremendously for healing.   We are sorry that you are not feeling well.  Here is how we plan to help!  Based on what you have shared with me it looks like you mostly have acute back pain.  Acute back pain is defined as musculoskeletal pain that can resolve in 1-3 weeks with conservative treatment.  I have prescribed Etodolac 300 mg twice a day non-steroid anti-inflammatory (NSAID) as well as Baclofen 10 mg every eight hours as needed which is a muscle relaxer  Some patients experience stomach irritation or in increased heartburn with anti-inflammatory drugs.  Please keep in mind that muscle relaxer's can cause fatigue and should not be taken while at work or driving.  Back pain is very common.  The pain often gets better over time.  The cause of back pain is usually not dangerous.  Most people can learn to manage their back pain on their own.  Home Care  Stay active.  Start with short walks on flat ground if you can.  Try to walk farther each day.  Do not sit, drive or stand in one place for more than 30 minutes.  Do not stay in bed.  Do not avoid exercise or work.  Activity can help your back heal faster.  Be careful when you bend or lift an object.  Bend at your knees, keep the object close to you, and do not twist.  Sleep on a firm mattress.  Lie on your side, and bend your knees.  If you lie on your back, put a pillow under your knees.  Only take medicines as told by your doctor.  Put ice on the injured  area.  Put ice in a plastic bag  Place a towel between your skin and the bag  Leave the ice on for 15-20 minutes, 3-4 times a day for the first 2-3 days. 210 After that, you can switch between ice and heat packs.  Ask your doctor about back exercises or massage.  Avoid feeling anxious or stressed.  Find good ways to deal with stress, such as exercise.  Get Help Right Way If:  Your pain does not go away with rest or medicine.  Your pain does not go away in 1 week.  You have new problems.  You do not feel well.  The pain spreads into your legs.  You cannot control when you poop (bowel movement) or pee (urinate)  You feel sick to your stomach (nauseous) or throw up (vomit)  You have belly (abdominal) pain.  You feel like you may pass out (faint).  If you develop a fever.  Make Sure you:  Understand these instructions.  Will watch your condition  Will get help right away if you are not doing well or get worse.  Your e-visit answers were reviewed by a board certified advanced clinical practitioner to complete your personal care plan.  Depending on the condition, your plan could have included both  over the counter or prescription medications.  If there is a problem please reply  once you have received a response from your provider.  Your safety is important to us.  If you have drug allergies check your prescription carefully.    You can use MyChart to ask questions about today's visit, request a non-urgent call back, or ask for a work or school excuse for 24 hours related to this e-Visit. If it has been greater than 24 hours you will need to follow up with your provider, or enter a new e-Visit to address those concerns.  You will get an e-mail in the next two days asking about your experience.  I hope that your e-visit has been valuable and will speed your recovery. Thank you for using e-visits.

## 2018-05-23 MED FILL — METOPROLOL SUCCINATE ER 25: 25 | 90 days supply | Qty: 90 | Fill #2

## 2018-05-23 MED FILL — HYDROCHLOROTHIAZIDE 12.5 MG: 12.5 | 90 days supply | Qty: 90 | Fill #2

## 2018-05-23 MED FILL — LOSARTAN POTASSIUM 100 MG T: 100 | 90 days supply | Qty: 90 | Fill #2

## 2018-09-11 DIAGNOSIS — I10 Essential (primary) hypertension: Secondary | ICD-10-CM | POA: Diagnosis not present

## 2018-09-11 DIAGNOSIS — K219 Gastro-esophageal reflux disease without esophagitis: Secondary | ICD-10-CM | POA: Diagnosis not present

## 2018-09-11 DIAGNOSIS — E785 Hyperlipidemia, unspecified: Secondary | ICD-10-CM | POA: Diagnosis not present

## 2018-09-11 DIAGNOSIS — Z0000 Encounter for general adult medical examination without abnormal findings: Secondary | ICD-10-CM | POA: Diagnosis not present

## 2018-09-11 DIAGNOSIS — M549 Dorsalgia, unspecified: Secondary | ICD-10-CM | POA: Diagnosis not present

## 2018-09-11 MED FILL — LANSOPRAZOLE DR 30 MG CAP: 30 | 90 days supply | Qty: 90 | Fill #0

## 2018-09-12 MED FILL — LOSARTAN POTASSIUM 100 MG T: 100 | 90 days supply | Qty: 90 | Fill #3

## 2018-09-12 MED FILL — HYDROCHLOROTHIAZIDE 12.5 MG: 12.5 | 90 days supply | Qty: 90 | Fill #3

## 2018-09-12 MED FILL — METOPROLOL SUCCINATE ER 25: 25 | 90 days supply | Qty: 90 | Fill #3

## 2018-10-18 ENCOUNTER — Other Ambulatory Visit: Payer: Self-pay | Admitting: Family Medicine

## 2018-10-18 DIAGNOSIS — Z1231 Encounter for screening mammogram for malignant neoplasm of breast: Secondary | ICD-10-CM

## 2018-12-07 ENCOUNTER — Other Ambulatory Visit: Payer: Self-pay

## 2018-12-07 ENCOUNTER — Ambulatory Visit
Admission: RE | Admit: 2018-12-07 | Discharge: 2018-12-07 | Disposition: A | Discharge status: Home / Self Care | Payer: 59 | Source: Ambulatory Visit | Attending: Family Medicine | Admitting: Family Medicine

## 2018-12-07 DIAGNOSIS — Z1231 Encounter for screening mammogram for malignant neoplasm of breast: Secondary | ICD-10-CM

## 2018-12-26 DIAGNOSIS — Z124 Encounter for screening for malignant neoplasm of cervix: Secondary | ICD-10-CM | POA: Diagnosis not present

## 2018-12-26 DIAGNOSIS — Z01419 Encounter for gynecological examination (general) (routine) without abnormal findings: Secondary | ICD-10-CM | POA: Diagnosis not present

## 2018-12-26 MED FILL — IBUPROFEN 600 MG TABLET: 600 | 90 days supply | Qty: 90 | Fill #0

## 2018-12-28 MED FILL — LANSOPRAZOLE DR 30 MG CAPSU: 30 | 90 days supply | Qty: 90 | Fill #1

## 2019-01-01 MED FILL — METOPROLOL SUCCINATE ER 25: 25 | 90 days supply | Qty: 90 | Fill #0

## 2019-01-01 MED FILL — HYDROCHLOROTHIAZIDE 12.5 MG: 12.5 | 90 days supply | Qty: 90 | Fill #0

## 2019-01-01 MED FILL — LOSARTAN POTASSIUM 100 MG T: 100 | 90 days supply | Qty: 90 | Fill #0

## 2019-01-02 DIAGNOSIS — H5213 Myopia, bilateral: Secondary | ICD-10-CM | POA: Diagnosis not present

## 2019-05-13 MED FILL — METOPROLOL SUCCINATE ER 25: 25 | 90 days supply | Qty: 90 | Fill #1

## 2019-05-13 MED FILL — HYDROCHLOROTHIAZIDE 12.5 MG: 12.5 | 90 days supply | Qty: 90 | Fill #1

## 2019-05-13 MED FILL — LOSARTAN POTASSIUM 100 MG T: 100 | 90 days supply | Qty: 90 | Fill #1

## 2019-08-29 MED FILL — IBUPROFEN 600 MG TABLET: 600 | 90 days supply | Qty: 90 | Fill #1

## 2019-08-29 MED FILL — HYDROCHLOROTHIAZIDE 12.5 MG: 12.5 | 90 days supply | Qty: 90 | Fill #2

## 2019-08-29 MED FILL — METOPROLOL SUCCINATE ER 25: 25 | 90 days supply | Qty: 90 | Fill #2

## 2019-08-29 MED FILL — LOSARTAN POTASSIUM 100 MG T: 100 | 90 days supply | Qty: 90 | Fill #2

## 2019-09-27 DIAGNOSIS — Z1211 Encounter for screening for malignant neoplasm of colon: Secondary | ICD-10-CM | POA: Diagnosis not present

## 2019-09-27 DIAGNOSIS — I10 Essential (primary) hypertension: Secondary | ICD-10-CM | POA: Diagnosis not present

## 2019-09-27 DIAGNOSIS — Z0000 Encounter for general adult medical examination without abnormal findings: Secondary | ICD-10-CM | POA: Diagnosis not present

## 2019-09-27 DIAGNOSIS — E785 Hyperlipidemia, unspecified: Secondary | ICD-10-CM | POA: Diagnosis not present

## 2019-12-11 ENCOUNTER — Other Ambulatory Visit: Payer: Self-pay | Admitting: Family Medicine

## 2019-12-11 DIAGNOSIS — Z1231 Encounter for screening mammogram for malignant neoplasm of breast: Secondary | ICD-10-CM

## 2019-12-13 MED FILL — METOPROLOL SUCCINATE ER 25: 25 | 90 days supply | Qty: 90 | Fill #3

## 2019-12-13 MED FILL — HYDROCHLOROTHIAZIDE 12.5 MG: 12.5 | 90 days supply | Qty: 90 | Fill #3

## 2019-12-13 MED FILL — LOSARTAN POTASSIUM 100 MG T: 100 | 90 days supply | Qty: 90 | Fill #3

## 2020-01-08 ENCOUNTER — Other Ambulatory Visit: Payer: Self-pay

## 2020-01-08 ENCOUNTER — Ambulatory Visit
Admission: RE | Admit: 2020-01-08 | Discharge: 2020-01-08 | Disposition: A | Discharge status: Home / Self Care | Payer: 59 | Source: Ambulatory Visit | Attending: Family Medicine | Admitting: Family Medicine

## 2020-01-08 DIAGNOSIS — Z1231 Encounter for screening mammogram for malignant neoplasm of breast: Secondary | ICD-10-CM

## 2020-02-12 DIAGNOSIS — H5213 Myopia, bilateral: Secondary | ICD-10-CM | POA: Diagnosis not present

## 2020-02-17 ENCOUNTER — Other Ambulatory Visit (HOSPITAL_COMMUNITY): Payer: Self-pay | Admitting: Gastroenterology

## 2020-02-17 DIAGNOSIS — Z01419 Encounter for gynecological examination (general) (routine) without abnormal findings: Secondary | ICD-10-CM | POA: Diagnosis not present

## 2020-04-07 ENCOUNTER — Other Ambulatory Visit (HOSPITAL_COMMUNITY): Payer: Self-pay | Admitting: Family Medicine

## 2020-04-07 MED FILL — METOPROLOL SUCCINATE ER 25: 25 | 90 days supply | Qty: 90 | Fill #0

## 2020-04-07 MED FILL — HYDROCHLOROTHIAZIDE 12.5 MG: 12.5 | 90 days supply | Qty: 90 | Fill #0

## 2020-04-07 MED FILL — PEG-3350 SOLUTION: 420 | 1 days supply | Qty: 4000 | Fill #0

## 2020-04-07 MED FILL — LOSARTAN POTASSIUM 100 MG T: 100 | 30 days supply | Qty: 30 | Fill #0

## 2020-06-08 MED FILL — Losartan Potassium Tab 100 MG: ORAL | 30 days supply | Qty: 30 | Fill #0 | Status: AC

## 2020-06-09 ENCOUNTER — Other Ambulatory Visit (HOSPITAL_COMMUNITY): Payer: Self-pay

## 2020-07-18 ENCOUNTER — Other Ambulatory Visit (HOSPITAL_COMMUNITY): Payer: Self-pay

## 2020-07-18 MED FILL — Losartan Potassium Tab 100 MG: ORAL | 30 days supply | Qty: 30 | Fill #1 | Status: AC

## 2020-07-18 MED FILL — Hydrochlorothiazide Tab 12.5 MG: ORAL | 90 days supply | Qty: 90 | Fill #0 | Status: AC

## 2020-07-20 ENCOUNTER — Other Ambulatory Visit (HOSPITAL_COMMUNITY): Payer: Self-pay

## 2020-07-20 MED ORDER — METOPROLOL SUCCINATE ER 25 MG PO TB24
25.0000 mg | ORAL_TABLET | Freq: Every day | ORAL | 1 refills | Status: DC
  Filled 2020-07-20: qty 90, 90d supply, fill #0

## 2020-07-22 ENCOUNTER — Other Ambulatory Visit (HOSPITAL_COMMUNITY): Payer: Self-pay

## 2020-08-24 ENCOUNTER — Other Ambulatory Visit (HOSPITAL_COMMUNITY): Payer: Self-pay

## 2020-08-24 MED ORDER — LOSARTAN POTASSIUM 100 MG PO TABS
100.0000 mg | ORAL_TABLET | Freq: Every day | ORAL | 1 refills | Status: DC
  Filled 2020-08-24: qty 90, 90d supply, fill #0
  Filled 2020-11-26: qty 90, 90d supply, fill #1

## 2020-10-09 ENCOUNTER — Other Ambulatory Visit (HOSPITAL_COMMUNITY): Payer: Self-pay

## 2020-10-09 DIAGNOSIS — K219 Gastro-esophageal reflux disease without esophagitis: Secondary | ICD-10-CM | POA: Diagnosis not present

## 2020-10-09 DIAGNOSIS — Z1211 Encounter for screening for malignant neoplasm of colon: Secondary | ICD-10-CM | POA: Diagnosis not present

## 2020-10-09 DIAGNOSIS — I10 Essential (primary) hypertension: Secondary | ICD-10-CM | POA: Diagnosis not present

## 2020-10-09 DIAGNOSIS — Z0000 Encounter for general adult medical examination without abnormal findings: Secondary | ICD-10-CM | POA: Diagnosis not present

## 2020-10-09 DIAGNOSIS — E785 Hyperlipidemia, unspecified: Secondary | ICD-10-CM | POA: Diagnosis not present

## 2020-10-09 MED ORDER — LANSOPRAZOLE 30 MG PO CPDR
30.0000 mg | DELAYED_RELEASE_CAPSULE | Freq: Every day | ORAL | 3 refills | Status: AC | PRN
  Filled 2020-10-09: qty 90, 90d supply, fill #0
  Filled 2021-08-23: qty 90, 90d supply, fill #1

## 2020-10-09 MED ORDER — LOSARTAN POTASSIUM 100 MG PO TABS
100.0000 mg | ORAL_TABLET | Freq: Every day | ORAL | 3 refills | Status: DC
  Filled 2020-10-09 – 2021-02-15 (×2): qty 90, 90d supply, fill #0
  Filled 2021-05-19: qty 90, 90d supply, fill #1
  Filled 2021-08-23: qty 90, 90d supply, fill #2

## 2020-10-09 MED ORDER — HYDROCHLOROTHIAZIDE 12.5 MG PO TABS
12.5000 mg | ORAL_TABLET | Freq: Every day | ORAL | 3 refills | Status: DC
  Filled 2020-10-09: qty 90, 90d supply, fill #0
  Filled 2021-02-15: qty 90, 90d supply, fill #1
  Filled 2021-05-19: qty 90, 90d supply, fill #2
  Filled 2021-08-23: qty 90, 90d supply, fill #3

## 2020-10-09 MED ORDER — METOPROLOL SUCCINATE ER 25 MG PO TB24
25.0000 mg | ORAL_TABLET | Freq: Every day | ORAL | 3 refills | Status: DC
  Filled 2020-10-09: qty 90, 90d supply, fill #0
  Filled 2021-02-15: qty 90, 90d supply, fill #1
  Filled 2021-05-19: qty 90, 90d supply, fill #2
  Filled 2021-08-23: qty 90, 90d supply, fill #3

## 2020-11-27 ENCOUNTER — Other Ambulatory Visit (HOSPITAL_COMMUNITY): Payer: Self-pay

## 2020-12-14 DIAGNOSIS — K648 Other hemorrhoids: Secondary | ICD-10-CM | POA: Diagnosis not present

## 2020-12-14 DIAGNOSIS — Z1211 Encounter for screening for malignant neoplasm of colon: Secondary | ICD-10-CM | POA: Diagnosis not present

## 2021-02-15 ENCOUNTER — Other Ambulatory Visit (HOSPITAL_COMMUNITY): Payer: Self-pay

## 2021-02-22 DIAGNOSIS — H5213 Myopia, bilateral: Secondary | ICD-10-CM | POA: Diagnosis not present

## 2021-03-24 ENCOUNTER — Other Ambulatory Visit: Payer: Self-pay | Admitting: Family Medicine

## 2021-03-24 DIAGNOSIS — Z1231 Encounter for screening mammogram for malignant neoplasm of breast: Secondary | ICD-10-CM

## 2021-04-08 ENCOUNTER — Ambulatory Visit
Admission: RE | Admit: 2021-04-08 | Discharge: 2021-04-08 | Disposition: A | Discharge status: Home / Self Care | Payer: 59 | Source: Ambulatory Visit | Attending: Family Medicine | Admitting: Family Medicine

## 2021-04-08 DIAGNOSIS — Z1231 Encounter for screening mammogram for malignant neoplasm of breast: Secondary | ICD-10-CM | POA: Diagnosis not present

## 2021-05-20 ENCOUNTER — Other Ambulatory Visit (HOSPITAL_COMMUNITY): Payer: Self-pay

## 2021-08-23 ENCOUNTER — Other Ambulatory Visit (HOSPITAL_COMMUNITY): Payer: Self-pay

## 2021-08-24 ENCOUNTER — Other Ambulatory Visit (HOSPITAL_COMMUNITY): Payer: Self-pay

## 2021-10-28 DIAGNOSIS — Z6841 Body Mass Index (BMI) 40.0 and over, adult: Secondary | ICD-10-CM | POA: Diagnosis not present

## 2021-10-28 DIAGNOSIS — Z5181 Encounter for therapeutic drug level monitoring: Secondary | ICD-10-CM | POA: Diagnosis not present

## 2021-10-28 DIAGNOSIS — E785 Hyperlipidemia, unspecified: Secondary | ICD-10-CM | POA: Diagnosis not present

## 2021-10-28 DIAGNOSIS — Z23 Encounter for immunization: Secondary | ICD-10-CM | POA: Diagnosis not present

## 2021-10-28 DIAGNOSIS — Z0000 Encounter for general adult medical examination without abnormal findings: Secondary | ICD-10-CM | POA: Diagnosis not present

## 2021-10-28 DIAGNOSIS — K219 Gastro-esophageal reflux disease without esophagitis: Secondary | ICD-10-CM | POA: Diagnosis not present

## 2021-10-28 DIAGNOSIS — I10 Essential (primary) hypertension: Secondary | ICD-10-CM | POA: Diagnosis not present

## 2021-11-12 DIAGNOSIS — R519 Headache, unspecified: Secondary | ICD-10-CM | POA: Diagnosis not present

## 2021-11-12 DIAGNOSIS — U071 COVID-19: Secondary | ICD-10-CM | POA: Diagnosis not present

## 2021-11-24 ENCOUNTER — Other Ambulatory Visit (HOSPITAL_COMMUNITY): Payer: Self-pay

## 2021-11-26 ENCOUNTER — Other Ambulatory Visit (HOSPITAL_COMMUNITY): Payer: Self-pay

## 2021-11-26 MED ORDER — METOPROLOL SUCCINATE ER 25 MG PO TB24
25.0000 mg | ORAL_TABLET | Freq: Every day | ORAL | 3 refills | Status: DC
  Filled 2021-11-26: qty 90, 90d supply, fill #0
  Filled 2022-03-03: qty 90, 90d supply, fill #1
  Filled 2022-06-13: qty 90, 90d supply, fill #2
  Filled 2022-09-25: qty 90, 90d supply, fill #3

## 2021-11-26 MED ORDER — HYDROCHLOROTHIAZIDE 12.5 MG PO TABS
12.5000 mg | ORAL_TABLET | Freq: Every day | ORAL | 3 refills | Status: DC
  Filled 2021-11-26: qty 90, 90d supply, fill #0
  Filled 2022-03-03: qty 90, 90d supply, fill #1
  Filled 2022-06-13: qty 90, 90d supply, fill #2
  Filled 2022-09-25: qty 90, 90d supply, fill #3

## 2021-11-26 MED ORDER — LOSARTAN POTASSIUM 100 MG PO TABS
100.0000 mg | ORAL_TABLET | Freq: Every day | ORAL | 3 refills | Status: DC
  Filled 2021-11-26: qty 90, 90d supply, fill #0
  Filled 2022-03-03: qty 90, 90d supply, fill #1
  Filled 2022-06-13: qty 90, 90d supply, fill #2
  Filled 2022-09-25: qty 90, 90d supply, fill #3

## 2021-12-01 ENCOUNTER — Other Ambulatory Visit (HOSPITAL_COMMUNITY): Payer: Self-pay

## 2021-12-01 DIAGNOSIS — Z1331 Encounter for screening for depression: Secondary | ICD-10-CM | POA: Diagnosis not present

## 2021-12-01 DIAGNOSIS — Z9189 Other specified personal risk factors, not elsewhere classified: Secondary | ICD-10-CM | POA: Diagnosis not present

## 2021-12-01 DIAGNOSIS — E8889 Other specified metabolic disorders: Secondary | ICD-10-CM | POA: Diagnosis not present

## 2021-12-01 DIAGNOSIS — I10 Essential (primary) hypertension: Secondary | ICD-10-CM | POA: Diagnosis not present

## 2021-12-01 DIAGNOSIS — Z6841 Body Mass Index (BMI) 40.0 and over, adult: Secondary | ICD-10-CM | POA: Diagnosis not present

## 2021-12-01 DIAGNOSIS — K219 Gastro-esophageal reflux disease without esophagitis: Secondary | ICD-10-CM | POA: Diagnosis not present

## 2021-12-01 DIAGNOSIS — E785 Hyperlipidemia, unspecified: Secondary | ICD-10-CM | POA: Diagnosis not present

## 2021-12-01 MED ORDER — WEGOVY 1 MG/0.5ML ~~LOC~~ SOAJ
1.0000 mg | SUBCUTANEOUS | 0 refills | Status: DC
  Filled 2021-12-01: qty 3, 28d supply, fill #0

## 2021-12-01 MED ORDER — WEGOVY 0.5 MG/0.5ML ~~LOC~~ SOAJ
0.5000 mg | SUBCUTANEOUS | 0 refills | Status: DC
  Filled 2021-12-01: qty 2, 28d supply, fill #0

## 2021-12-01 MED ORDER — WEGOVY 0.25 MG/0.5ML ~~LOC~~ SOAJ
0.2500 mg | SUBCUTANEOUS | 0 refills | Status: DC
  Filled 2021-12-01: qty 2, 28d supply, fill #0

## 2021-12-15 DIAGNOSIS — E669 Obesity, unspecified: Secondary | ICD-10-CM | POA: Diagnosis not present

## 2021-12-15 DIAGNOSIS — I10 Essential (primary) hypertension: Secondary | ICD-10-CM | POA: Diagnosis not present

## 2021-12-15 DIAGNOSIS — E785 Hyperlipidemia, unspecified: Secondary | ICD-10-CM | POA: Diagnosis not present

## 2021-12-27 ENCOUNTER — Other Ambulatory Visit (HOSPITAL_COMMUNITY): Payer: Self-pay

## 2021-12-30 ENCOUNTER — Other Ambulatory Visit (HOSPITAL_COMMUNITY): Payer: Self-pay

## 2021-12-30 DIAGNOSIS — E669 Obesity, unspecified: Secondary | ICD-10-CM | POA: Diagnosis not present

## 2021-12-30 DIAGNOSIS — E785 Hyperlipidemia, unspecified: Secondary | ICD-10-CM | POA: Diagnosis not present

## 2021-12-30 DIAGNOSIS — I10 Essential (primary) hypertension: Secondary | ICD-10-CM | POA: Diagnosis not present

## 2021-12-30 DIAGNOSIS — Z6839 Body mass index (BMI) 39.0-39.9, adult: Secondary | ICD-10-CM | POA: Diagnosis not present

## 2021-12-30 DIAGNOSIS — Z9189 Other specified personal risk factors, not elsewhere classified: Secondary | ICD-10-CM | POA: Diagnosis not present

## 2021-12-30 MED ORDER — PHENTERMINE HCL 15 MG PO CAPS
15.0000 mg | ORAL_CAPSULE | Freq: Every day | ORAL | 0 refills | Status: DC
  Filled 2021-12-30: qty 30, 30d supply, fill #0

## 2022-01-13 DIAGNOSIS — E669 Obesity, unspecified: Secondary | ICD-10-CM | POA: Diagnosis not present

## 2022-01-13 DIAGNOSIS — Z6837 Body mass index (BMI) 37.0-37.9, adult: Secondary | ICD-10-CM | POA: Diagnosis not present

## 2022-01-13 DIAGNOSIS — I10 Essential (primary) hypertension: Secondary | ICD-10-CM | POA: Diagnosis not present

## 2022-01-13 DIAGNOSIS — Z9189 Other specified personal risk factors, not elsewhere classified: Secondary | ICD-10-CM | POA: Diagnosis not present

## 2022-01-14 ENCOUNTER — Other Ambulatory Visit (HOSPITAL_COMMUNITY): Payer: Self-pay

## 2022-01-14 MED ORDER — PHENTERMINE HCL 15 MG PO CAPS
15.0000 mg | ORAL_CAPSULE | Freq: Every day | ORAL | 0 refills | Status: DC
  Filled 2022-01-30: qty 30, 30d supply, fill #0

## 2022-01-31 ENCOUNTER — Other Ambulatory Visit (HOSPITAL_COMMUNITY): Payer: Self-pay

## 2022-02-14 ENCOUNTER — Other Ambulatory Visit (HOSPITAL_COMMUNITY): Payer: Self-pay

## 2022-02-14 DIAGNOSIS — I10 Essential (primary) hypertension: Secondary | ICD-10-CM | POA: Diagnosis not present

## 2022-02-14 DIAGNOSIS — Z6837 Body mass index (BMI) 37.0-37.9, adult: Secondary | ICD-10-CM | POA: Diagnosis not present

## 2022-02-14 DIAGNOSIS — E669 Obesity, unspecified: Secondary | ICD-10-CM | POA: Diagnosis not present

## 2022-02-14 DIAGNOSIS — Z9189 Other specified personal risk factors, not elsewhere classified: Secondary | ICD-10-CM | POA: Diagnosis not present

## 2022-02-14 MED ORDER — ZEPBOUND 2.5 MG/0.5ML ~~LOC~~ SOAJ: 2.5000 mg | SUBCUTANEOUS | 0 refills | Status: DC

## 2022-03-02 DIAGNOSIS — H5213 Myopia, bilateral: Secondary | ICD-10-CM | POA: Diagnosis not present

## 2022-03-03 ENCOUNTER — Other Ambulatory Visit (HOSPITAL_COMMUNITY): Payer: Self-pay

## 2022-03-04 ENCOUNTER — Other Ambulatory Visit (HOSPITAL_COMMUNITY): Payer: Self-pay

## 2022-03-04 MED ORDER — PHENTERMINE HCL 15 MG PO CAPS
15.0000 mg | ORAL_CAPSULE | Freq: Every day | ORAL | 0 refills | Status: DC
  Filled 2022-03-04: qty 30, 30d supply, fill #0

## 2022-03-14 DIAGNOSIS — I10 Essential (primary) hypertension: Secondary | ICD-10-CM | POA: Diagnosis not present

## 2022-03-14 DIAGNOSIS — Z6837 Body mass index (BMI) 37.0-37.9, adult: Secondary | ICD-10-CM | POA: Diagnosis not present

## 2022-03-14 DIAGNOSIS — Z658 Other specified problems related to psychosocial circumstances: Secondary | ICD-10-CM | POA: Diagnosis not present

## 2022-03-14 DIAGNOSIS — E669 Obesity, unspecified: Secondary | ICD-10-CM | POA: Diagnosis not present

## 2022-04-14 ENCOUNTER — Other Ambulatory Visit (HOSPITAL_COMMUNITY): Payer: Self-pay

## 2022-04-14 DIAGNOSIS — E669 Obesity, unspecified: Secondary | ICD-10-CM | POA: Diagnosis not present

## 2022-04-14 DIAGNOSIS — I10 Essential (primary) hypertension: Secondary | ICD-10-CM | POA: Diagnosis not present

## 2022-04-14 DIAGNOSIS — Z6837 Body mass index (BMI) 37.0-37.9, adult: Secondary | ICD-10-CM | POA: Diagnosis not present

## 2022-04-14 DIAGNOSIS — Z658 Other specified problems related to psychosocial circumstances: Secondary | ICD-10-CM | POA: Diagnosis not present

## 2022-04-14 MED ORDER — PHENTERMINE HCL 15 MG PO CAPS
15.0000 mg | ORAL_CAPSULE | Freq: Every day | ORAL | 0 refills | Status: DC
  Filled 2022-04-14: qty 30, 30d supply, fill #0

## 2022-04-14 MED ORDER — ZEPBOUND 2.5 MG/0.5ML ~~LOC~~ SOAJ
2.5000 mg | SUBCUTANEOUS | 0 refills | Status: DC
  Filled 2022-04-14 – 2022-04-20 (×2): qty 2, 28d supply, fill #0

## 2022-04-15 ENCOUNTER — Other Ambulatory Visit (HOSPITAL_COMMUNITY): Payer: Self-pay

## 2022-04-20 ENCOUNTER — Other Ambulatory Visit (HOSPITAL_COMMUNITY): Payer: Self-pay

## 2022-05-02 ENCOUNTER — Other Ambulatory Visit (HOSPITAL_COMMUNITY): Payer: Self-pay

## 2022-05-02 DIAGNOSIS — I10 Essential (primary) hypertension: Secondary | ICD-10-CM | POA: Diagnosis not present

## 2022-05-02 DIAGNOSIS — Z6837 Body mass index (BMI) 37.0-37.9, adult: Secondary | ICD-10-CM | POA: Diagnosis not present

## 2022-05-02 DIAGNOSIS — E669 Obesity, unspecified: Secondary | ICD-10-CM | POA: Diagnosis not present

## 2022-05-02 DIAGNOSIS — Z658 Other specified problems related to psychosocial circumstances: Secondary | ICD-10-CM | POA: Diagnosis not present

## 2022-05-02 MED ORDER — ZEPBOUND 2.5 MG/0.5ML ~~LOC~~ SOAJ
2.5000 mg | SUBCUTANEOUS | 0 refills | Status: DC
  Filled 2022-05-02: qty 2, 28d supply, fill #0

## 2022-05-17 ENCOUNTER — Other Ambulatory Visit: Payer: Self-pay | Admitting: Family Medicine

## 2022-05-17 DIAGNOSIS — Z1231 Encounter for screening mammogram for malignant neoplasm of breast: Secondary | ICD-10-CM

## 2022-05-18 ENCOUNTER — Ambulatory Visit
Admission: RE | Admit: 2022-05-18 | Discharge: 2022-05-18 | Disposition: A | Discharge status: Home / Self Care | Payer: Commercial Managed Care - PPO | Source: Ambulatory Visit | Attending: Family Medicine | Admitting: Family Medicine

## 2022-05-18 DIAGNOSIS — Z1231 Encounter for screening mammogram for malignant neoplasm of breast: Secondary | ICD-10-CM

## 2022-05-24 ENCOUNTER — Other Ambulatory Visit (HOSPITAL_COMMUNITY): Payer: Self-pay

## 2022-05-24 DIAGNOSIS — Z6837 Body mass index (BMI) 37.0-37.9, adult: Secondary | ICD-10-CM | POA: Diagnosis not present

## 2022-05-24 DIAGNOSIS — E669 Obesity, unspecified: Secondary | ICD-10-CM | POA: Diagnosis not present

## 2022-05-24 DIAGNOSIS — Z658 Other specified problems related to psychosocial circumstances: Secondary | ICD-10-CM | POA: Diagnosis not present

## 2022-05-24 DIAGNOSIS — I10 Essential (primary) hypertension: Secondary | ICD-10-CM | POA: Diagnosis not present

## 2022-05-24 MED ORDER — PHENTERMINE HCL 15 MG PO CAPS
15.0000 mg | ORAL_CAPSULE | Freq: Every day | ORAL | 0 refills | Status: DC
  Filled 2022-05-24: qty 30, 30d supply, fill #0

## 2022-05-30 ENCOUNTER — Other Ambulatory Visit (HOSPITAL_COMMUNITY): Payer: Self-pay

## 2022-06-13 ENCOUNTER — Other Ambulatory Visit: Payer: Self-pay

## 2022-06-13 ENCOUNTER — Other Ambulatory Visit (HOSPITAL_COMMUNITY): Payer: Self-pay

## 2022-12-06 DIAGNOSIS — Z6841 Body Mass Index (BMI) 40.0 and over, adult: Secondary | ICD-10-CM | POA: Diagnosis not present

## 2022-12-06 DIAGNOSIS — E785 Hyperlipidemia, unspecified: Secondary | ICD-10-CM | POA: Diagnosis not present

## 2022-12-06 DIAGNOSIS — Z0000 Encounter for general adult medical examination without abnormal findings: Secondary | ICD-10-CM | POA: Diagnosis not present

## 2022-12-06 DIAGNOSIS — I10 Essential (primary) hypertension: Secondary | ICD-10-CM | POA: Diagnosis not present

## 2022-12-06 DIAGNOSIS — K219 Gastro-esophageal reflux disease without esophagitis: Secondary | ICD-10-CM | POA: Diagnosis not present

## 2022-12-06 DIAGNOSIS — Z5181 Encounter for therapeutic drug level monitoring: Secondary | ICD-10-CM | POA: Diagnosis not present

## 2022-12-06 DIAGNOSIS — R350 Frequency of micturition: Secondary | ICD-10-CM | POA: Diagnosis not present

## 2023-01-08 ENCOUNTER — Other Ambulatory Visit (HOSPITAL_COMMUNITY): Payer: Self-pay

## 2023-01-09 ENCOUNTER — Other Ambulatory Visit (HOSPITAL_COMMUNITY): Payer: Self-pay

## 2023-01-09 MED ORDER — HYDROCHLOROTHIAZIDE 12.5 MG PO TABS
12.5000 mg | ORAL_TABLET | Freq: Every day | ORAL | 3 refills | Status: DC
  Filled 2023-01-09: qty 90, 90d supply, fill #0
  Filled 2023-04-10: qty 90, 90d supply, fill #1
  Filled 2023-07-24: qty 90, 90d supply, fill #2
  Filled 2023-10-30: qty 90, 90d supply, fill #3

## 2023-01-09 MED ORDER — METOPROLOL SUCCINATE ER 25 MG PO TB24
25.0000 mg | ORAL_TABLET | Freq: Every day | ORAL | 3 refills | Status: DC
  Filled 2023-01-09: qty 90, 90d supply, fill #0
  Filled 2023-04-10: qty 90, 90d supply, fill #1
  Filled 2023-07-24: qty 90, 90d supply, fill #2
  Filled 2023-10-30: qty 90, 90d supply, fill #3

## 2023-01-09 MED ORDER — LOSARTAN POTASSIUM 100 MG PO TABS
100.0000 mg | ORAL_TABLET | Freq: Every day | ORAL | 3 refills | Status: DC
  Filled 2023-01-09: qty 90, 90d supply, fill #0
  Filled 2023-04-10: qty 90, 90d supply, fill #1
  Filled 2023-07-24: qty 90, 90d supply, fill #2
  Filled 2023-10-30: qty 90, 90d supply, fill #3

## 2023-01-10 ENCOUNTER — Other Ambulatory Visit (HOSPITAL_COMMUNITY): Payer: Self-pay

## 2023-03-15 ENCOUNTER — Other Ambulatory Visit (HOSPITAL_COMMUNITY): Payer: Self-pay

## 2023-03-15 ENCOUNTER — Telehealth: Payer: Commercial Managed Care - PPO | Admitting: Family Medicine

## 2023-03-15 DIAGNOSIS — J208 Acute bronchitis due to other specified organisms: Secondary | ICD-10-CM

## 2023-03-15 DIAGNOSIS — B9689 Other specified bacterial agents as the cause of diseases classified elsewhere: Secondary | ICD-10-CM

## 2023-03-15 MED ORDER — PROMETHAZINE-DM 6.25-15 MG/5ML PO SYRP
5.0000 mL | ORAL_SOLUTION | Freq: Four times a day (QID) | ORAL | 0 refills | Status: AC | PRN
  Filled 2023-03-15: qty 118, 6d supply, fill #0

## 2023-03-15 MED ORDER — AZITHROMYCIN 250 MG PO TABS
ORAL_TABLET | ORAL | 0 refills | Status: AC
  Filled 2023-03-15: qty 6, 5d supply, fill #0

## 2023-03-15 NOTE — Patient Instructions (Addendum)
  Felicia Boyd, thank you for joining [name], NP for today's virtual visit.  While this provider is not your primary care provider (PCP), if your PCP is located in our provider database this encounter information will be shared with them immediately following your visit.   A [facility] MyChart Boyd gives you access to today's visit and all your visits, tests, and labs performed at [facility]  click here if you don't have a Felicia Boyd or go to [email or web address]  Consent: (Patient) Felicia Boyd provided verbal consent for this virtual visit at the beginning of the encounter.  Current Medications:  Current Outpatient Medications:    hydrochlorothiazide  (HYDRODIURIL ) 12.5 MG tablet, Take 1 tablet (12.5 mg total) by mouth daily., Disp: 90 tablet, Rfl: 3   lansoprazole  (PREVACID ) 30 MG capsule, Take 1 capsule (30 mg total) by mouth daily as needed before a meal, Disp: 90 capsule, Rfl: 3   losartan  (COZAAR ) 100 MG tablet, Take 1 tablet (100 mg total) by mouth daily., Disp: 90 tablet, Rfl: 3   metoprolol  succinate (TOPROL -XL) 25 MG 24 hr tablet, Take 1 tablet (25 mg total) by mouth daily., Disp: 90 tablet, Rfl: 3   Medications ordered in this encounter:  No orders of the defined types were placed in this encounter.    *If you need refills on other medications prior to your next appointment, please contact your pharmacy*  Follow-Up: Call back or seek an in-person evaluation if the symptoms worsen or if the condition fails to improve as anticipated.  [facility] [phone]  Other Instructions   Take meds as prescribed - Rest voice - Use a cool mist humidifier especially during the winter months when heat dries out the air. - Use saline nose sprays frequently to help soothe nasal passages if they are drying out. - Stay hydrated by drinking plenty of fluids - Keep thermostat turn down low to prevent drying out which  can cause a dry cough.   If you do not improve you will need a follow up visit in person.    If you have been instructed to have an in-person evaluation today at a local Urgent Care facility, please use the link below. It will take you to a list of all of our available [facility], including address, phone number and hours of operation. Please do not delay care.  [facility]  If you or a family member do not have a primary care provider, use the link below to schedule a visit and establish care. When you choose a [facility] primary care physician or advanced practice provider, you gain a long-term partner in health. Find a Primary Care Provider  Learn more about [facility]'s in-office and virtual care options:  - Get Care Now

## 2023-03-15 NOTE — Progress Notes (Signed)
 Virtual Visit Consent   [name], you are scheduled for a virtual visit with a [facility] provider today. Just as with appointments in the office, your consent must be obtained to participate. Your consent will be active for this visit and any virtual visit you may have with one of our providers in the next 365 days. If you have a MyChart account, a copy of this consent can be sent to you electronically.  As this is a virtual visit, video technology does not allow for your provider to perform a traditional examination. This may limit your provider's ability to fully assess your condition. If your provider identifies any concerns that need to be evaluated in person or the need to arrange testing (such as labs, EKG, etc.), we will make arrangements to do so. Although advances in technology are sophisticated, we cannot ensure that it will always work on either your end or our end. If the connection with a video visit is poor, the visit may have to be switched to a telephone visit. With either a video or telephone visit, we are not always able to ensure that we have a secure connection.  By engaging in this virtual visit, you consent to the provision of healthcare and authorize for your insurance to be billed (if applicable) for the services provided during this visit. Depending on your insurance coverage, you may receive a charge related to this service.  I need to obtain your verbal consent now. Are you willing to proceed with your visit today? Felicia Boyd has provided verbal consent on 03/15/2023 for a virtual visit (video or telephone). Felicia CHRISTELLA Barefoot, NP  Date: 03/15/2023 1:35 PM  Virtual Visit via Video Note   I, Felicia Boyd, connected with  Felicia Boyd  ([id number], May 27, 1970) on 03/15/23 at  1:30 PM EST by a video-enabled telemedicine application and verified that I am speaking with the correct person using two identifiers.  Location: Patient: Virtual Visit Location  Patient: Home Provider: Virtual Visit Location Provider: Home Office   I discussed the limitations of evaluation and management by telemedicine and the availability of in person appointments. The patient expressed understanding and agreed to proceed.    History of Present Illness: Felicia Boyd is a 53 y.o. who identifies as a female who was assigned female at birth, and is being seen today for cough and congestion  Onset was recent URI and used OTC it got better then restarted two ago. Associated symptoms are congestion cough, drainage is yellow tan and thickening up  Modifying factors are used OTC to help with URI, but this URI has returned  Denies chest pain, shortness of breath, fevers, chills  Exposure to sick contacts- unknown-  COVID test: neg Vaccines: Flu UTD   Problems: There are no active problems to display for this patient.   Allergies:  Allergies  Allergen Reactions   Ace Inhibitors Cough   Hydrochlorothiazide      dose 25mg  and higher=hair falls out   Medications:  Current Outpatient Medications:    hydrochlorothiazide  (HYDRODIURIL ) 12.5 MG tablet, Take 1 tablet (12.5 mg total) by mouth daily., Disp: 90 tablet, Rfl: 3   lansoprazole  (PREVACID ) 30 MG capsule, Take 1 capsule (30 mg total) by mouth daily as needed before a meal, Disp: 90 capsule, Rfl: 3   losartan  (COZAAR ) 100 MG tablet, Take 1 tablet (100 mg total) by mouth daily., Disp: 90 tablet, Rfl: 3   metoprolol  succinate (TOPROL -XL) 25 MG 24 hr tablet,  Take 1 tablet (25 mg total) by mouth daily., Disp: 90 tablet, Rfl: 3  Observations/Objective: Patient is well-developed, well-nourished in no acute distress.  Resting comfortably  at home.  Head is normocephalic, atraumatic.  No labored breathing.  Speech is clear and coherent with logical content.  Patient is alert and oriented at baseline.    Assessment and Plan:  1. Acute bacterial bronchitis (Primary)  - promethazine -dextromethorphan  (PROMETHAZINE -DM) 6.25-15 MG/5ML syrup; Take 5 mLs by mouth 4 (four) times daily as needed for cough.  Dispense: 118 mL; Refill: 0 - azithromycin  (ZITHROMAX ) 250 MG tablet; Take 2 tablets on day 1, then 1 tablet daily on days 2 through 5  Dispense: 6 tablet; Refill: 0  - Take meds as prescribed - Rest voice - Use a cool mist humidifier especially during the winter months when heat dries out the air. - Use saline nose sprays frequently to help soothe nasal passages if they are drying out. - Stay hydrated by drinking plenty of fluids - Keep thermostat turn down low to prevent drying out which can cause a dry cough.   If you do not improve you will need a follow up visit in person.                Reviewed side effects, risks and benefits of medication.    Patient acknowledged agreement and understanding of the plan.   Past Medical, Surgical, Social History, Allergies, and Medications have been Reviewed.     Follow Up Instructions: I discussed the assessment and treatment plan with the patient. The patient was provided an opportunity to ask questions and all were answered. The patient agreed with the plan and demonstrated an understanding of the instructions.  A copy of instructions were sent to the patient via MyChart unless otherwise noted below.    The patient was advised to call back or seek an in-person evaluation if the symptoms worsen or if the condition fails to improve as anticipated.    Felicia CHRISTELLA Barefoot, NP

## 2023-03-21 IMAGING — MG MM DIGITAL SCREENING BILAT W/ TOMO AND CAD
8 series · 8 of 24 positions shown · non-contrast
Comparison: Previous exam(s).

CLINICAL DATA: Screening.

EXAM:
DIGITAL SCREENING BILATERAL MAMMOGRAM WITH TOMOSYNTHESIS AND CAD
TECHNIQUE: Bilateral screening digital craniocaudal and mediolateral oblique
mammograms were obtained. Bilateral screening digital breast
tomosynthesis was performed. The images were evaluated with
computer-aided detection.

[L MLO synth-2D]
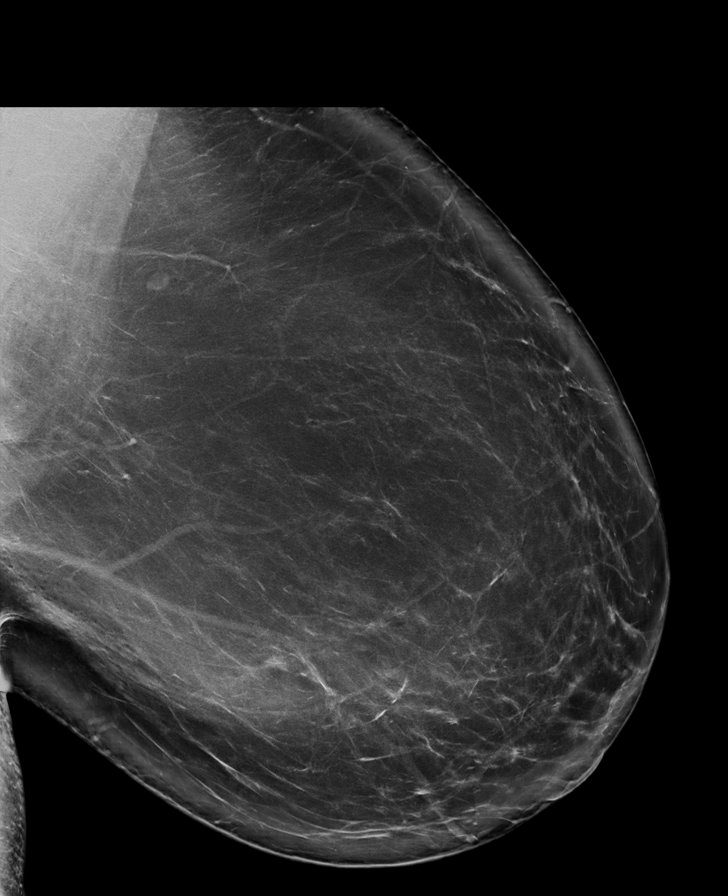

[R CC synth-2D]
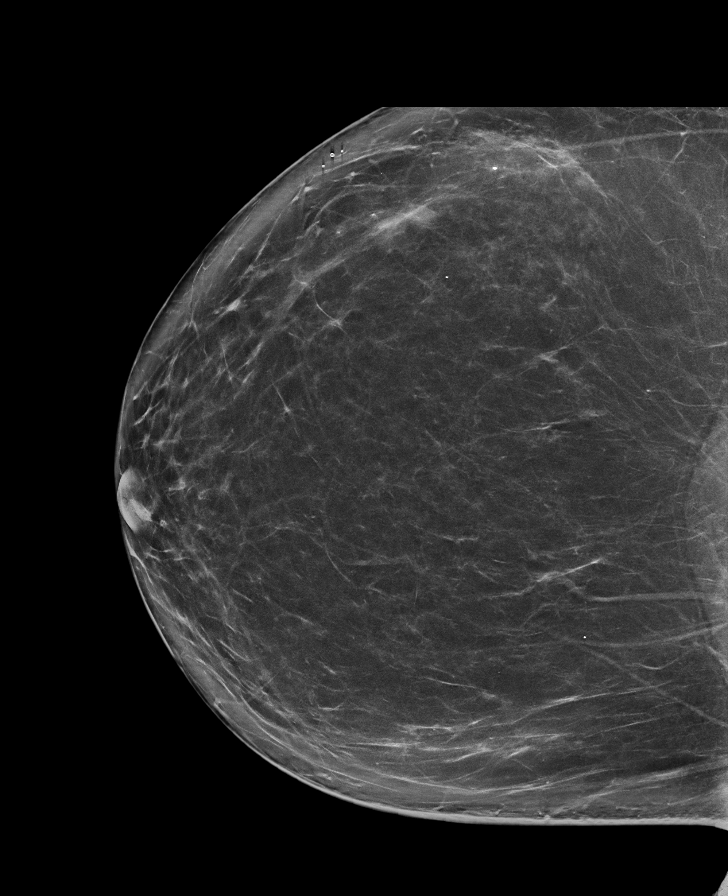

[L CC synth-2D]
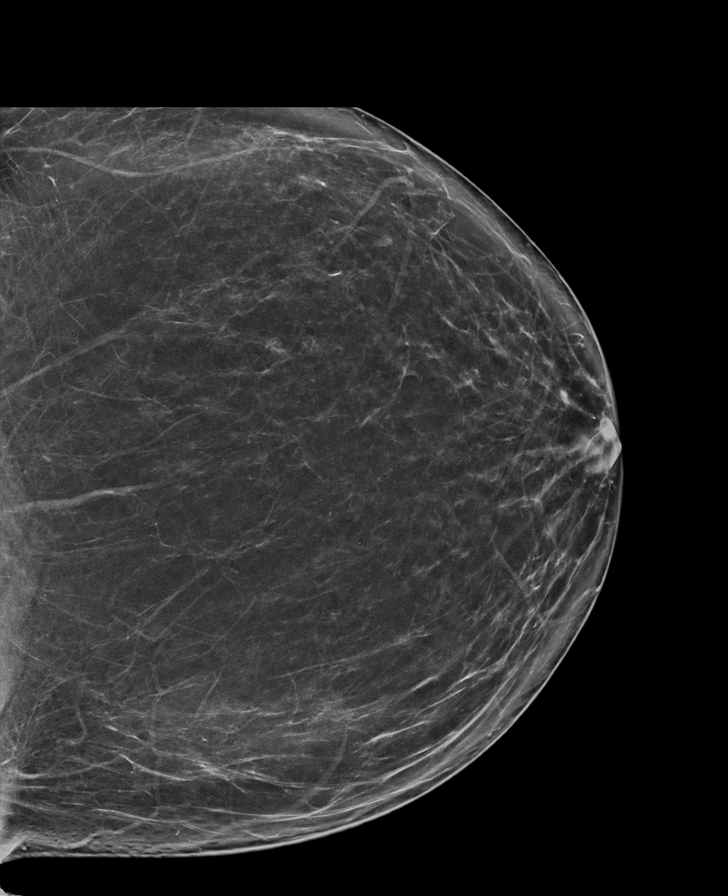

[R MLO synth-2D]
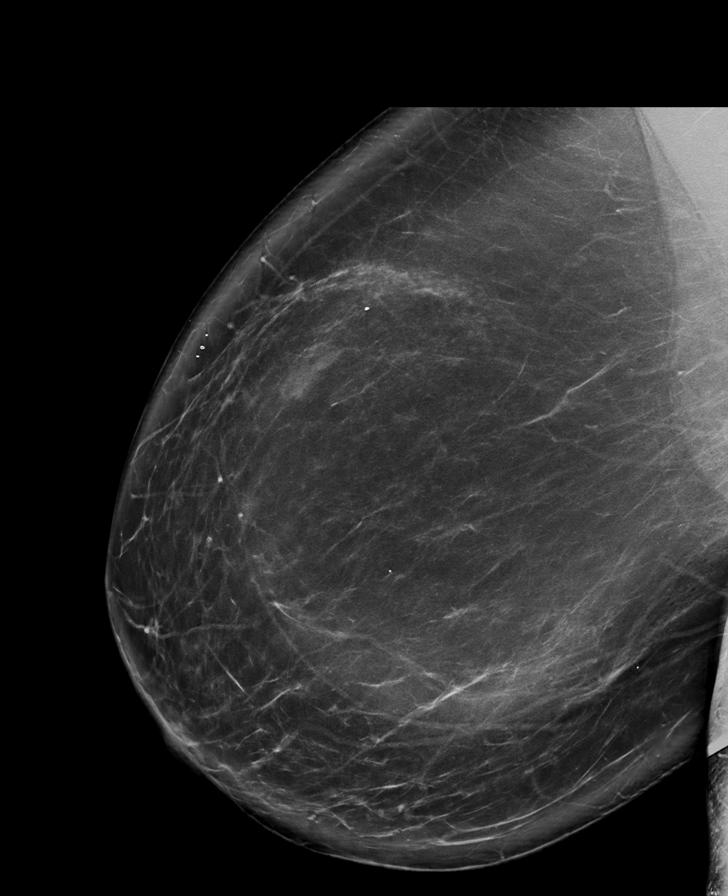

[R MLO tomo · tomo slice 57/112.0]
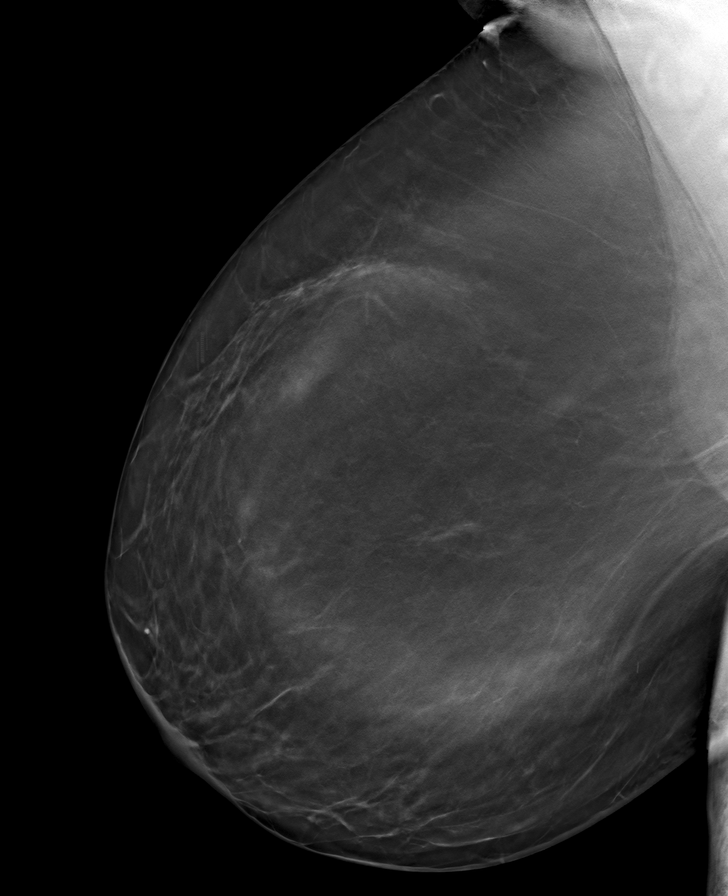

[L MLO tomo · tomo slice 58/115.0]
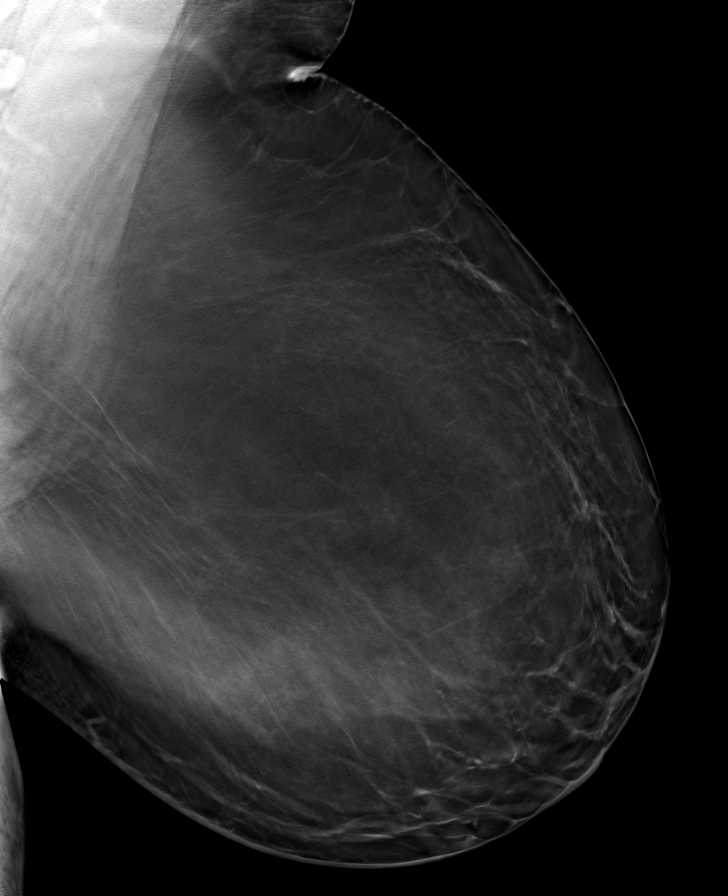

[R CC tomo · tomo slice 50/99.0]
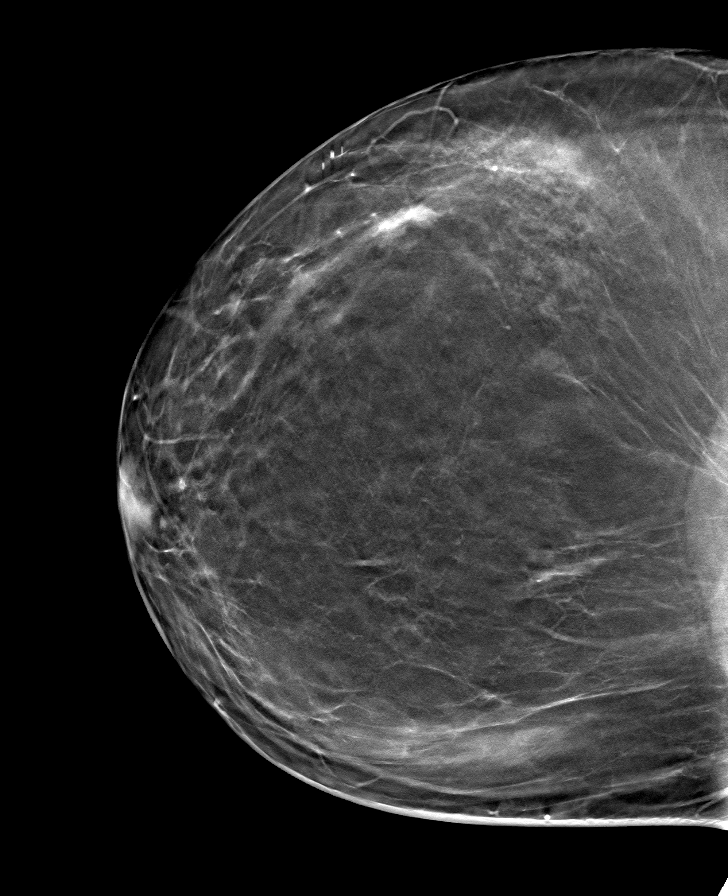

[L CC tomo · tomo slice 47/93.0]
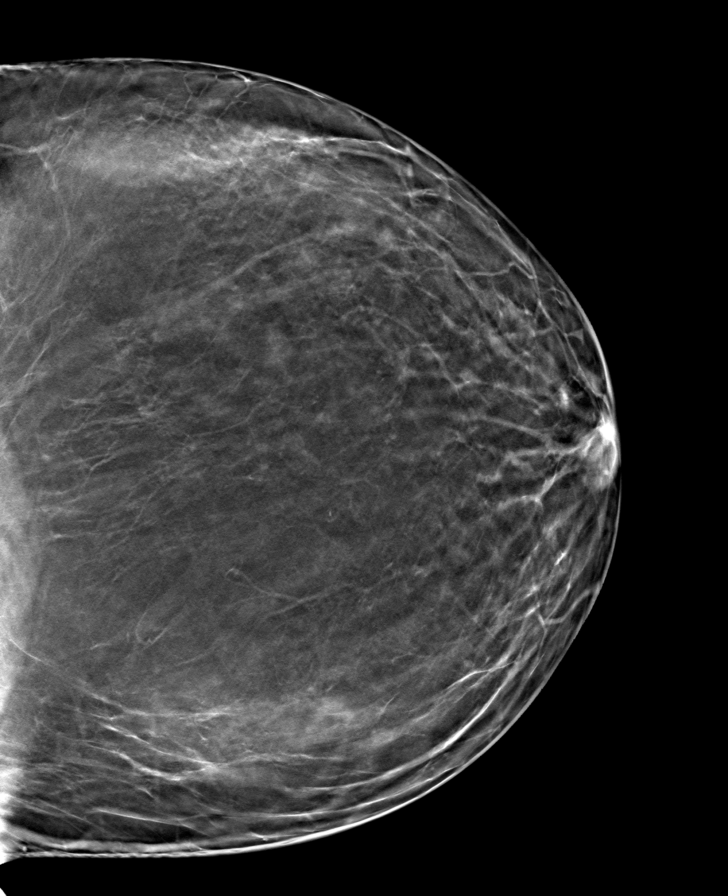

[8 of 24 positions shown; findings below may reference images not displayed]

ACR Breast Density Category b: There are scattered areas of
fibroglandular density.
FINDINGS: There are no findings suspicious for malignancy.
IMPRESSION: No mammographic evidence of malignancy. A result letter of this
screening mammogram will be mailed directly to the patient.

RECOMMENDATION:
Screening mammogram in one year. (Code:51-O-LD2)

BI-RADS CATEGORY  1: Negative.

## 2023-03-30 DIAGNOSIS — H5213 Myopia, bilateral: Secondary | ICD-10-CM | POA: Diagnosis not present

## 2023-04-10 ENCOUNTER — Other Ambulatory Visit (HOSPITAL_COMMUNITY): Payer: Self-pay

## 2023-04-10 ENCOUNTER — Other Ambulatory Visit: Payer: Self-pay

## 2023-04-11 ENCOUNTER — Encounter (HOSPITAL_COMMUNITY): Payer: Self-pay

## 2023-04-11 ENCOUNTER — Other Ambulatory Visit (HOSPITAL_COMMUNITY): Payer: Self-pay

## 2023-04-12 ENCOUNTER — Other Ambulatory Visit (HOSPITAL_COMMUNITY): Payer: Self-pay

## 2023-04-12 MED ORDER — LANSOPRAZOLE 30 MG PO CPDR
30.0000 mg | DELAYED_RELEASE_CAPSULE | Freq: Every day | ORAL | 3 refills | Status: AC | PRN
  Filled 2023-04-12: qty 90, 90d supply, fill #0

## 2023-05-23 ENCOUNTER — Other Ambulatory Visit: Payer: Self-pay | Admitting: Family Medicine

## 2023-05-23 DIAGNOSIS — Z1231 Encounter for screening mammogram for malignant neoplasm of breast: Secondary | ICD-10-CM

## 2023-05-30 ENCOUNTER — Ambulatory Visit
Admission: RE | Admit: 2023-05-30 | Discharge: 2023-05-30 | Disposition: A | Discharge status: Home / Self Care | Source: Ambulatory Visit | Attending: Family Medicine | Admitting: Family Medicine

## 2023-05-30 DIAGNOSIS — Z1231 Encounter for screening mammogram for malignant neoplasm of breast: Secondary | ICD-10-CM

## 2023-07-03 DIAGNOSIS — N951 Menopausal and female climacteric states: Secondary | ICD-10-CM | POA: Diagnosis not present

## 2023-07-03 DIAGNOSIS — Z01419 Encounter for gynecological examination (general) (routine) without abnormal findings: Secondary | ICD-10-CM | POA: Diagnosis not present

## 2023-07-03 DIAGNOSIS — Z124 Encounter for screening for malignant neoplasm of cervix: Secondary | ICD-10-CM | POA: Diagnosis not present

## 2023-12-26 ENCOUNTER — Other Ambulatory Visit (HOSPITAL_COMMUNITY): Payer: Self-pay

## 2023-12-26 DIAGNOSIS — I10 Essential (primary) hypertension: Secondary | ICD-10-CM | POA: Diagnosis not present

## 2023-12-26 DIAGNOSIS — K219 Gastro-esophageal reflux disease without esophagitis: Secondary | ICD-10-CM | POA: Diagnosis not present

## 2023-12-26 DIAGNOSIS — E785 Hyperlipidemia, unspecified: Secondary | ICD-10-CM | POA: Diagnosis not present

## 2023-12-26 DIAGNOSIS — Z6841 Body Mass Index (BMI) 40.0 and over, adult: Secondary | ICD-10-CM | POA: Diagnosis not present

## 2023-12-26 DIAGNOSIS — Z0000 Encounter for general adult medical examination without abnormal findings: Secondary | ICD-10-CM | POA: Diagnosis not present

## 2023-12-26 DIAGNOSIS — Z23 Encounter for immunization: Secondary | ICD-10-CM | POA: Diagnosis not present

## 2023-12-26 DIAGNOSIS — J019 Acute sinusitis, unspecified: Secondary | ICD-10-CM | POA: Diagnosis not present

## 2023-12-26 MED ORDER — AMOXICILLIN-POT CLAVULANATE 875-125 MG PO TABS
1.0000 | ORAL_TABLET | Freq: Two times a day (BID) | ORAL | 0 refills | Status: AC
Start: 2023-12-26 — End: ?
  Filled 2023-12-26: qty 20, 10d supply, fill #0

## 2024-02-08 ENCOUNTER — Other Ambulatory Visit (HOSPITAL_COMMUNITY): Payer: Self-pay

## 2024-02-09 ENCOUNTER — Other Ambulatory Visit (HOSPITAL_COMMUNITY): Payer: Self-pay

## 2024-02-09 MED ORDER — LOSARTAN POTASSIUM 100 MG PO TABS
100.0000 mg | ORAL_TABLET | Freq: Every day | ORAL | 3 refills | Status: AC
  Filled 2024-02-09: qty 90, 90d supply, fill #0

## 2024-02-09 MED ORDER — METOPROLOL SUCCINATE ER 25 MG PO TB24
25.0000 mg | ORAL_TABLET | Freq: Every day | ORAL | 3 refills | Status: AC
  Filled 2024-02-09: qty 90, 90d supply, fill #0

## 2024-02-09 MED ORDER — HYDROCHLOROTHIAZIDE 12.5 MG PO TABS
12.5000 mg | ORAL_TABLET | Freq: Every morning | ORAL | 3 refills | Status: AC
  Filled 2024-02-09: qty 90, 90d supply, fill #0
